# Patient Record
Sex: Female | Born: 1995 | Race: Black or African American | Hispanic: No | Smoking: Never smoker
Health system: Southern US, Community
[De-identification: ages and names within clinical notes are randomized; demographics above are authoritative.]

## PROBLEM LIST (undated history)

## (undated) ENCOUNTER — Inpatient Hospital Stay (HOSPITAL_COMMUNITY): Payer: Self-pay

## (undated) ENCOUNTER — Ambulatory Visit: Admission: EM | Payer: Medicaid Other | Source: Home / Self Care

## (undated) DIAGNOSIS — O139 Gestational [pregnancy-induced] hypertension without significant proteinuria, unspecified trimester: Secondary | ICD-10-CM

## (undated) DIAGNOSIS — Z789 Other specified health status: Secondary | ICD-10-CM

## (undated) DIAGNOSIS — O24419 Gestational diabetes mellitus in pregnancy, unspecified control: Secondary | ICD-10-CM

## (undated) DIAGNOSIS — O09299 Supervision of pregnancy with other poor reproductive or obstetric history, unspecified trimester: Secondary | ICD-10-CM

## (undated) DIAGNOSIS — I10 Essential (primary) hypertension: Secondary | ICD-10-CM

## (undated) HISTORY — DX: Essential (primary) hypertension: I10

## (undated) HISTORY — DX: Gestational (pregnancy-induced) hypertension without significant proteinuria, unspecified trimester: O13.9

## (undated) HISTORY — DX: Gestational diabetes mellitus in pregnancy, unspecified control: O24.419

## (undated) HISTORY — PX: OTHER SURGICAL HISTORY: SHX169

## (undated) HISTORY — DX: Supervision of pregnancy with other poor reproductive or obstetric history, unspecified trimester: O09.299

## (undated) NOTE — *Deleted (*Deleted)
OB ADMISSION/ HISTORY & PHYSICAL:  Admission Date: 03/17/2020  6:05 PM  Admit Diagnosis: gestational hypertension  Dana Solomon is a 57 y.o. female G45P0101 [redacted]w[redacted]d presenting for elevated BP in office. Endorses active FM, denies LOF and vaginal bleeding. Hx of pre-eclampsia w/ last preg. Transitional housing (Lives @ [facility]).   History of current pregnancy: G2P0101   Patient entered care with [facility] at 13+3 wks.   EDC 03/22/20 by [place] @ 8+5 wks   Antenatal testing: for BMI 43 started at 32 weeks Last evaluation: 39+2 wks 7lbs 5oz, 43%, vtx, AFI 8, ant placenta, BPP 8/8 Significant prenatal events:  Patient Active Problem List   Diagnosis Date Noted  . Pre-eclampsia 03/17/2020  . Rh negative state in antepartum period 03/17/2020  . GBS (group B Streptococcus carrier), +RV culture, currently pregnant 03/17/2020  . History of severe pre-eclampsia 03/17/2020  . Maternal obesity affecting pregnancy, antepartum 03/17/2020    Prenatal Labs: ABO, Rh: --/--/O NEG (11/24 1852) Antibody: NEG (11/24 1852) Rubella:   immune RPR:   NR HBsAg:   NR HIV:   NR GTT: passed GBS:   positive GC/CHL: neg/neg Genetics: insufficient fetal DNA Tdap/influenza vaccines: declined both   OB History  Gravida Para Term Preterm AB Living  2 1   1   1   SAB TAB Ectopic Multiple Live Births          1    # Outcome Date GA Lbr Len/2nd Weight Sex Delivery Anes PTL Lv  2 Current           1 Preterm 2014 [redacted]w[redacted]d    Vag-Spont       Medical / Surgical History: Past medical history:  Past Medical History:  Diagnosis Date  . Gestational diabetes    first pregnancy  . Hypertension   . Medical history non-contributory   . Pregnancy induced hypertension     Past surgical history:  Past Surgical History:  Procedure Laterality Date  . Belly button      approx 10 years ago  . naval     age 52y   Family History:  Family History  Problem Relation Age of Onset  . Hypertension Father     Social  History:  reports that she has never smoked. She has never used smokeless tobacco. She reports previous alcohol use. She reports that she does not use drugs.  Allergies: Patient has no known allergies.   Current Medications at time of admission:  Prior to Admission medications   Medication Sig Start Date End Date Taking? Authorizing Provider  aspirin EC 81 MG tablet Take 81 mg by mouth daily. Swallow whole.   Yes [provider]  Magnesium Oxide (MAG-OXIDE) 200 MG TABS Take 2 tablets (400 mg total) by mouth at bedtime. If that amount causes loose stools in the am, switch to 200mg  daily at bedtime. 01/30/20  Yes R, CNM  ondansetron (ZOFRAN ODT) 4 MG disintegrating tablet Take 1 tablet (4 mg total) by mouth every 8 (eight) hours as needed for nausea or vomiting. 10/20/19  Yes Edd Arbour, CNM  Prenatal Vit-Fe Fumarate-FA (PRENATAL MULTIVITAMIN) TABS tablet Take 1 tablet by mouth daily at 12 noon.   Yes [provider]  Elastic Bandages & Supports (COMFORT FIT MATERNITY SUPP LG) MISC 1 Units by Does not apply route daily. 01/30/20   Donette Larry, CNM    Review of Systems: Constitutional: Negative   HENT: Negative   Eyes: Negative   Respiratory:  Negative   Cardiovascular: Negative   Gastrointestinal: Negative  Genitourinary: neg for bloody show, neg for LOF   Musculoskeletal: Negative   Skin: Negative   Neurological: Negative   Endo/Heme/Allergies: Negative   Psychiatric/Behavioral: Negative    Physical Exam: VS: Blood pressure 121/62, pulse (!) 102, temperature 98 F (36.7 C), temperature source Oral, resp. rate 18, height 5' 4.5" (1.638 m), weight 134.1 kg, last menstrual period 06/23/2019. AAO x3, no signs of distress Cardiovascular: RRR Respiratory: Lung fields clear to ausculation GU/GI: Abdomen gravid, non-tender, non-distended, active FM, vertex, EFW 7.5# per Leopold's Extremities: trace, non-pitting edema, negative for pain,  tenderness, and cords  Cervical exam:Dilation: Fingertip Effacement (%): Thick Station: Ballotable Exam by:: Lizbeth Bark CNM FHR: baseline rate 125 / variability moderate / accelerations present / absent decelerations TOCO: irreg   Prenatal Transfer Tool  Maternal Diabetes: No Genetic Screening: insufficient fetal sample Maternal Ultrasounds/Referrals: Normal Fetal Ultrasounds or other Referrals:  None Maternal Substance Abuse:  No Significant Maternal Medications:  None Significant Maternal Lab Results: Group B Strep positive    Assessment: 25 y.o. G2P0101 [redacted]w[redacted]d  IOL for GHTN Rh neg FHR category 1 GBS positive Pain management plan: desires unmedicated labor and birth   Plan:  Admit to L&D Routine admission orders Epidural PRN PCN for GBS prophylaxis Rhogam PP Dr Richardson Dopp notified of admission and plan of care  Roma Schanz MSN, CNM 03/17/2020 9:42 PM

---
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
## 2018-07-18 DIAGNOSIS — B9689 Other specified bacterial agents as the cause of diseases classified elsewhere: Secondary | ICD-10-CM | POA: Diagnosis not present

## 2018-07-18 DIAGNOSIS — Z975 Presence of (intrauterine) contraceptive device: Secondary | ICD-10-CM | POA: Diagnosis not present

## 2018-07-18 DIAGNOSIS — N912 Amenorrhea, unspecified: Secondary | ICD-10-CM | POA: Diagnosis not present

## 2018-07-18 DIAGNOSIS — N760 Acute vaginitis: Secondary | ICD-10-CM | POA: Diagnosis not present

## 2018-07-18 DIAGNOSIS — Z3202 Encounter for pregnancy test, result negative: Secondary | ICD-10-CM | POA: Diagnosis not present

## 2019-04-25 NOTE — L&D Delivery Note (Signed)
Delivery Note:   G2P0101 at [redacted]w[redacted]d  Admitting diagnosis: Pre-eclampsia [O14.90] Risks: Hypertension, GBS + Onset of labor: 11/25 @ 1439 IOL/Augmentation: AROM, Pitocin, Cytotec and IP Foley ROM: 11/25 @ 1439  Complete dilation at 03/18/2020  2045 Onset of pushing at 2048 FHR second stage Cat I  Analgesia /Anesthesia intrapartum:Epidural  Pushing in lithotomy position with CNM and L&D staff support. No support person present.   Delivery of a Live born female  Birth Weight: 2886g, 6lb 5.8oz  APGAR: 8, 9   Newborn Delivery   Birth date/time: 03/18/2020 20:51:00 Delivery type: Vaginal, Spontaneous      in cephalic presentation, position OA to LOA.  APGAR:1 min-8 , 5 min-9   Nuchal Cord: No  Cord double clamped after cessation of pulsation, cut by CNM.  Collection of cord blood for typing completed. Cord blood donation-None  Arterial cord blood sample-No    Placenta delivered-Spontaneous  with 3 vessels . Uterotonics: Pitocin Placenta to L&D Uterine tone firm  Bleeding scant  Small bleeding abrasion at the vaginal opening identified.  Episiotomy:None  Local analgesia: N/A  Repair: 2-0 in usual fashion with excellent hemostasis Est. Blood Loss (mL):50.00   Complications: None  Mom to postpartum.  Baby Na'zy to Couplet care / Skin to Skin.  Delivery Report:   Review the Delivery Report for details.    [name], CNM, MSN 03/18/2020, 9:10 PM

## 2019-07-30 DIAGNOSIS — O26891 Other specified pregnancy related conditions, first trimester: Secondary | ICD-10-CM | POA: Diagnosis not present

## 2019-07-30 DIAGNOSIS — Z3201 Encounter for pregnancy test, result positive: Secondary | ICD-10-CM | POA: Diagnosis not present

## 2019-07-30 DIAGNOSIS — Z3A00 Weeks of gestation of pregnancy not specified: Secondary | ICD-10-CM | POA: Diagnosis not present

## 2019-07-30 DIAGNOSIS — R109 Unspecified abdominal pain: Secondary | ICD-10-CM | POA: Diagnosis not present

## 2019-07-30 DIAGNOSIS — Z79899 Other long term (current) drug therapy: Secondary | ICD-10-CM | POA: Diagnosis not present

## 2019-08-19 DIAGNOSIS — O26851 Spotting complicating pregnancy, first trimester: Secondary | ICD-10-CM | POA: Diagnosis not present

## 2019-08-19 DIAGNOSIS — O3680X0 Pregnancy with inconclusive fetal viability, not applicable or unspecified: Secondary | ICD-10-CM | POA: Diagnosis not present

## 2019-08-19 DIAGNOSIS — N926 Irregular menstruation, unspecified: Secondary | ICD-10-CM | POA: Diagnosis not present

## 2019-09-02 DIAGNOSIS — Z8751 Personal history of pre-term labor: Secondary | ICD-10-CM | POA: Insufficient documentation

## 2019-09-02 DIAGNOSIS — Z3481 Encounter for supervision of other normal pregnancy, first trimester: Secondary | ICD-10-CM | POA: Diagnosis not present

## 2019-09-03 DIAGNOSIS — Z7689 Persons encountering health services in other specified circumstances: Secondary | ICD-10-CM | POA: Diagnosis not present

## 2019-09-11 ENCOUNTER — Encounter (HOSPITAL_COMMUNITY): Payer: Self-pay | Admitting: Obstetrics & Gynecology

## 2019-09-11 ENCOUNTER — Inpatient Hospital Stay (HOSPITAL_COMMUNITY)
Admission: AD | Admit: 2019-09-11 | Discharge: 2019-09-11 | Disposition: A | Discharge status: Home / Self Care | Payer: Medicaid Other | Attending: Obstetrics & Gynecology | Admitting: Obstetrics & Gynecology

## 2019-09-11 ENCOUNTER — Inpatient Hospital Stay (HOSPITAL_COMMUNITY): Payer: Medicaid Other

## 2019-09-11 DIAGNOSIS — O021 Missed abortion: Secondary | ICD-10-CM | POA: Insufficient documentation

## 2019-09-11 DIAGNOSIS — Z3A12 12 weeks gestation of pregnancy: Secondary | ICD-10-CM | POA: Diagnosis not present

## 2019-09-11 DIAGNOSIS — O26891 Other specified pregnancy related conditions, first trimester: Secondary | ICD-10-CM

## 2019-09-11 DIAGNOSIS — Z590 Homelessness: Secondary | ICD-10-CM | POA: Insufficient documentation

## 2019-09-11 DIAGNOSIS — R102 Pelvic and perineal pain: Secondary | ICD-10-CM | POA: Diagnosis not present

## 2019-09-11 DIAGNOSIS — Z3A09 9 weeks gestation of pregnancy: Secondary | ICD-10-CM | POA: Insufficient documentation

## 2019-09-11 DIAGNOSIS — R109 Unspecified abdominal pain: Secondary | ICD-10-CM | POA: Diagnosis not present

## 2019-09-11 DIAGNOSIS — Z658 Other specified problems related to psychosocial circumstances: Secondary | ICD-10-CM

## 2019-09-11 LAB — POCT PREGNANCY, URINE: Preg Test, Ur: POSITIVE — AB

## 2019-09-11 NOTE — MAU Provider Note (Signed)
Chief Complaint: needs pregnancy confirmation letter   First Provider Initiated Contact with Patient 09/11/19 2023        SUBJECTIVE HPI: Dana Solomon is a 24 y.o. G1P0 at [redacted]w[redacted]d by LMP who presents to maternity admissions reporting pelvic cramping that comes and goes.  Had some care in [place] but had to move here to live in a maternity home ([facility]) and needs a proof of pregnancy letter.  States had one US showing SIUP but no embryo.  She denies vaginal bleeding, vaginal itching/burning, urinary symptoms, h/a, dizziness, n/v, or fever/chills  Abdominal Pain This is a recurrent problem. The current episode started in the past 7 days. The onset quality is gradual. The problem occurs intermittently. The problem has been unchanged. The patient is experiencing no pain (no pain today). The quality of the pain is cramping. The abdominal pain does not radiate. Pertinent negatives include no constipation, diarrhea, dysuria, fever or frequency. Nothing aggravates the pain. The pain is relieved by nothing. She has tried nothing for the symptoms.   RN Note: Needs pregnancy confirmation letter for shelter where she lives. Was getting care in [facility] and has just moved here. Has appt with [facility] end of May. No pain or bleeding tonight. Has occ abd pain and back pain that eventually goes away.   No past medical history on file.  Social History   Socioeconomic History  . Marital status: Single    Spouse name: Not on file  . Number of children: Not on file  . Years of education: Not on file  . Highest education level: Not on file  Occupational History  . Not on file  Tobacco Use  . Smoking status: Not on file  Substance and Sexual Activity  . Alcohol use: Not on file  . Drug use: Not on file  . Sexual activity: Not on file  Other Topics Concern  . Not on file  Social History Narrative  . Not on file   Social Determinants of Health   Financial Resource Strain:   . Difficulty  of Paying Living Expenses:   Food Insecurity:   . Worried About Programme researcher, broadcasting/film/video in the Last Year:   . Barista in the Last Year:   Transportation Needs:   . Freight forwarder (Medical):   Marland Kitchen Lack of Transportation (Non-Medical):   Physical Activity:   . Days of Exercise per Week:   . Minutes of Exercise per Session:   Stress:   . Feeling of Stress :   Social Connections:   . Frequency of Communication with Friends and Family:   . Frequency of Social Gatherings with Friends and Family:   . Attends Religious Services:   . Active Member of Clubs or Organizations:   . Attends Banker Meetings:   Marland Kitchen Marital Status:   Intimate Partner Violence:   . Fear of Current or Ex-Partner:   . Emotionally Abused:   Marland Kitchen Physically Abused:   . Sexually Abused:    No current facility-administered medications on file prior to encounter.   No current outpatient medications on file prior to encounter.   Not on File  I have reviewed patient's Past Medical Hx, Surgical Hx, Family Hx, Social Hx, medications and allergies.   ROS:  Review of Systems  Constitutional: Negative for fever.  Gastrointestinal: Positive for abdominal pain. Negative for constipation and diarrhea.  Genitourinary: Negative for dysuria and frequency.   Review of Systems  Other systems  negative   Physical Exam  Physical Exam Patient Vitals for the past 24 hrs:  BP Temp Pulse Resp Height Weight  09/11/19 1938 118/90 98.6 F (37 C) (!) 104 18 5' 4.5" (1.638 m) 126.6 kg   Constitutional: Well-developed, well-nourished female in no acute distress.  Cardiovascular: normal rate Respiratory: normal effort GI: Abd soft, non-tender. Pos BS x 4 MS: Extremities nontender, no edema, normal ROM Neurologic: Alert and oriented x 4.  GU: Neg CVAT.  PELVIC EXAM: Deferred due to recent exam and no current pain or bleeding.   LAB RESULTS Results for orders placed or performed during the hospital encounter  of 09/11/19 (from the past 24 hour(s))  Pregnancy, urine POC     Status: Abnormal   Collection Time: 09/11/19  7:58 PM  Result Value Ref Range   Preg Test, Ur POSITIVE (A) NEGATIVE     IMAGING US OB Comp Less 14 Wks  Result Date: 09/11/2019 CLINICAL DATA:  Abdominal and back pain. Gestational age by last menstrual period is 9 weeks 0 days. EXAM: OBSTETRIC <14 WK [place] AND TRANSVAGINAL OB US TECHNIQUE: Both transabdominal and transvaginal ultrasound examinations were performed for complete evaluation of the gestation as well as the maternal uterus, adnexal regions, and pelvic cul-de-sac. Transvaginal technique was performed to assess early pregnancy. COMPARISON:  None. FINDINGS: Intrauterine gestational sac: Single Yolk sac:  Not Visualized. Embryo:  Visualized. Cardiac Activity: Visualized. Heart Rate: 161 bpm CRL:  59.9 mm   12 w   3 d                  [place] EDC: 03/22/2020 Subchorionic hemorrhage:  None visualized. Maternal uterus/adnexae: Normal. IMPRESSION: Single live intrauterine pregnancy.  No subchorionic hemorrhage. Electronically Signed   By: Zerita Boers M.D.   On: 09/11/2019 21:05     MAU Management/MDM: Ordered followup Ultrasound to rule out missed abortion.  This pain can represent a normal pregnancy with pain, missed abortion.  The process as listed above helps to determine which of these is present.  ASSESSMENT [place] IUP at [redacted]w[redacted]d Pelvic pain in early pregnancy Low social support  PLAN Discharge home Recommend followup with prenatal visits as scheduled Letter of proof of pregnancy given  Pt stable at time of discharge. Encouraged to return here or to other Urgent Care/ED if she develops worsening of symptoms, increase in pain, fever, or other concerning symptoms.    Hansel Feinstein CNM, MSN Certified Nurse-Midwife 09/11/2019  8:23 PM

## 2019-09-11 NOTE — Discharge Instructions (Signed)
First Trimester of Pregnancy The first trimester of pregnancy is from week 1 until the end of week 13 (months 1 through 3). A week after a sperm fertilizes an egg, the egg will implant on the wall of the uterus. This embryo will begin to develop into a baby. Genes from you and your partner will form the baby. The female genes will determine whether the baby will be a boy or a girl. At 6-8 weeks, the eyes and face will be formed, and the heartbeat can be seen on ultrasound. At the end of 12 weeks, all the baby's organs will be formed. Now that you are pregnant, you will want to do everything you can to have a healthy baby. Two of the most important things are to get good prenatal care and to follow your health care provider's instructions. Prenatal care is all the medical care you receive before the baby's birth. This care will help prevent, find, and treat any problems during the pregnancy and childbirth. Body changes during your first trimester Your body goes through many changes during pregnancy. The changes vary from woman to woman.  You may gain or lose a couple of pounds at first.  You may feel sick to your stomach (nauseous) and you may throw up (vomit). If the vomiting is uncontrollable, call your health care provider.  You may tire easily.  You may develop headaches that can be relieved by medicines. All medicines should be approved by your health care provider.  You may urinate more often. Painful urination may mean you have a bladder infection.  You may develop heartburn as a result of your pregnancy.  You may develop constipation because certain hormones are causing the muscles that push stool through your intestines to slow down.  You may develop hemorrhoids or swollen veins (varicose veins).  Your breasts may begin to grow larger and become tender. Your nipples may stick out more, and the tissue that surrounds them (areola) may become darker.  Your gums may bleed and may be  sensitive to brushing and flossing.  Dark spots or blotches (chloasma, mask of pregnancy) may develop on your face. This will likely fade after the baby is born.  Your menstrual periods will stop.  You may have a loss of appetite.  You may develop cravings for certain kinds of food.  You may have changes in your emotions from day to day, such as being excited to be pregnant or being concerned that something may go wrong with the pregnancy and baby.  You may have more vivid and strange dreams.  You may have changes in your hair. These can include thickening of your hair, rapid growth, and changes in texture. Some women also have hair loss during or after pregnancy, or hair that feels dry or thin. Your hair will most likely return to normal after your baby is born. What to expect at prenatal visits During a routine prenatal visit:  You will be weighed to make sure you and the baby are growing normally.  Your blood pressure will be taken.  Your abdomen will be measured to track your baby's growth.  The fetal heartbeat will be listened to between weeks 10 and 14 of your pregnancy.  Test results from any previous visits will be discussed. Your health care provider may ask you:  How you are feeling.  If you are feeling the baby move.  If you have had any abnormal symptoms, such as leaking fluid, bleeding, severe headaches, or abdominal   cramping.  If you are using any tobacco products, including cigarettes, chewing tobacco, and electronic cigarettes.  If you have any questions. Other tests that may be performed during your first trimester include:  Blood tests to find your blood type and to check for the presence of any previous infections. The tests will also be used to check for low iron levels (anemia) and protein on red blood cells (Rh antibodies). Depending on your risk factors, or if you previously had diabetes during pregnancy, you may have tests to check for high blood sugar  that affects pregnant women (gestational diabetes).  Urine tests to check for infections, diabetes, or protein in the urine.  An ultrasound to confirm the proper growth and development of the baby.  Fetal screens for spinal cord problems (spina bifida) and Down syndrome.  HIV (human immunodeficiency virus) testing. Routine prenatal testing includes screening for HIV, unless you choose not to have this test.  You may need other tests to make sure you and the baby are doing well. Follow these instructions at home: Medicines  Follow your health care provider's instructions regarding medicine use. Specific medicines may be either safe or unsafe to take during pregnancy.  Take a prenatal vitamin that contains at least 600 micrograms (mcg) of folic acid.  If you develop constipation, try taking a stool softener if your health care provider approves. Eating and drinking   Eat a balanced diet that includes fresh fruits and vegetables, whole grains, good sources of protein such as meat, eggs, or tofu, and low-fat dairy. Your health care provider will help you determine the amount of weight gain that is right for you.  Avoid raw meat and uncooked cheese. These carry germs that can cause birth defects in the baby.  Eating four or five small meals rather than three large meals a day may help relieve nausea and vomiting. If you start to feel nauseous, eating a few soda crackers can be helpful. Drinking liquids between meals, instead of during meals, also seems to help ease nausea and vomiting.  Limit foods that are high in fat and processed sugars, such as fried and sweet foods.  To prevent constipation: ? Eat foods that are high in fiber, such as fresh fruits and vegetables, whole grains, and beans. ? Drink enough fluid to keep your urine clear or pale yellow. Activity  Exercise only as directed by your health care provider. Most women can continue their usual exercise routine during  pregnancy. Try to exercise for 30 minutes at least 5 days a week. Exercising will help you: ? Control your weight. ? Stay in shape. ? Be prepared for labor and delivery.  Experiencing pain or cramping in the lower abdomen or lower back is a good sign that you should stop exercising. Check with your health care provider before continuing with normal exercises.  Try to avoid standing for long periods of time. Move your legs often if you must stand in one place for a long time.  Avoid heavy lifting.  Wear low-heeled shoes and practice good posture.  You may continue to have sex unless your health care provider tells you not to. Relieving pain and discomfort  Wear a good support bra to relieve breast tenderness.  Take warm sitz baths to soothe any pain or discomfort caused by hemorrhoids. Use hemorrhoid cream if your health care provider approves.  Rest with your legs elevated if you have leg cramps or low back pain.  If you develop varicose veins in   your legs, wear support hose. Elevate your feet for 15 minutes, 3-4 times a day. Limit salt in your diet. Prenatal care  Schedule your prenatal visits by the twelfth week of pregnancy. They are usually scheduled monthly at first, then more often in the last 2 months before delivery.  Write down your questions. Take them to your prenatal visits.  Keep all your prenatal visits as told by your health care provider. This is important. Safety  Wear your seat belt at all times when driving.  Make a list of emergency phone numbers, including numbers for family, friends, the hospital, and police and fire departments. General instructions  Ask your health care provider for a referral to a local prenatal education class. Begin classes no later than the beginning of month 6 of your pregnancy.  Ask for help if you have counseling or nutritional needs during pregnancy. Your health care provider can offer advice or refer you to specialists for help  with various needs.  Do not use hot tubs, steam rooms, or saunas.  Do not douche or use tampons or scented sanitary pads.  Do not cross your legs for long periods of time.  Avoid cat litter boxes and soil used by cats. These carry germs that can cause birth defects in the baby and possibly loss of the fetus by miscarriage or stillbirth.  Avoid all smoking, herbs, alcohol, and medicines not prescribed by your health care provider. Chemicals in these products affect the formation and growth of the baby.  Do not use any products that contain nicotine or tobacco, such as cigarettes and e-cigarettes. If you need help quitting, ask your health care provider. You may receive counseling support and other resources to help you quit.  Schedule a dentist appointment. At home, brush your teeth with a soft toothbrush and be gentle when you floss. Contact a health care provider if:  You have dizziness.  You have mild pelvic cramps, pelvic pressure, or nagging pain in the abdominal area.  You have persistent nausea, vomiting, or diarrhea.  You have a bad smelling vaginal discharge.  You have pain when you urinate.  You notice increased swelling in your face, hands, legs, or ankles.  You are exposed to fifth disease or chickenpox.  You are exposed to German measles (rubella) and have never had it. Get help right away if:  You have a fever.  You are leaking fluid from your vagina.  You have spotting or bleeding from your vagina.  You have severe abdominal cramping or pain.  You have rapid weight gain or loss.  You vomit blood or material that looks like coffee grounds.  You develop a severe headache.  You have shortness of breath.  You have any kind of trauma, such as from a fall or a car accident. Summary  The first trimester of pregnancy is from week 1 until the end of week 13 (months 1 through 3).  Your body goes through many changes during pregnancy. The changes vary from  woman to woman.  You will have routine prenatal visits. During those visits, your health care provider will examine you, discuss any test results you may have, and talk with you about how you are feeling. This information is not intended to replace advice given to you by your health care provider. Make sure you discuss any questions you have with your health care provider. Document Revised: 03/23/2017 Document Reviewed: 03/22/2016 Elsevier Patient Education  2020 [employer] Inc.  

## 2019-09-11 NOTE — MAU Note (Signed)
Wynelle Bourgeois CNM in Triage talking with pt.

## 2019-09-11 NOTE — MAU Note (Signed)
Needs pregnancy confirmation letter for shelter where she lives. Was getting care in [facility] and has just moved here. Has appt with [facility] end of May. No pain or bleeding tonight. Has occ abd pain and back pain that eventually goes away.

## 2019-09-11 NOTE — MAU Note (Signed)
Wynelle Bourgeois CNM in Triage to discuss u/s results and d/c plan with pt. Pt did receive preg verification letter at d/c. Will keep scheduled appt with [facility]

## 2019-09-18 DIAGNOSIS — O3680X9 Pregnancy with inconclusive fetal viability, other fetus: Secondary | ICD-10-CM | POA: Diagnosis not present

## 2019-09-18 DIAGNOSIS — N925 Other specified irregular menstruation: Secondary | ICD-10-CM | POA: Diagnosis not present

## 2019-09-18 DIAGNOSIS — Z3A13 13 weeks gestation of pregnancy: Secondary | ICD-10-CM | POA: Diagnosis not present

## 2019-09-25 ENCOUNTER — Other Ambulatory Visit: Payer: Self-pay

## 2019-09-25 DIAGNOSIS — Z113 Encounter for screening for infections with a predominantly sexual mode of transmission: Secondary | ICD-10-CM | POA: Diagnosis not present

## 2019-09-25 DIAGNOSIS — Z3A14 14 weeks gestation of pregnancy: Secondary | ICD-10-CM | POA: Diagnosis not present

## 2019-09-26 ENCOUNTER — Other Ambulatory Visit: Payer: Self-pay

## 2019-09-26 DIAGNOSIS — Z8759 Personal history of other complications of pregnancy, childbirth and the puerperium: Secondary | ICD-10-CM | POA: Diagnosis not present

## 2019-10-15 DIAGNOSIS — R7309 Other abnormal glucose: Secondary | ICD-10-CM | POA: Diagnosis not present

## 2019-10-20 ENCOUNTER — Inpatient Hospital Stay (HOSPITAL_COMMUNITY)
Admission: AD | Admit: 2019-10-20 | Discharge: 2019-10-20 | Disposition: A | Discharge status: Home / Self Care | Payer: Medicaid Other | Attending: Obstetrics and Gynecology | Admitting: Obstetrics and Gynecology

## 2019-10-20 ENCOUNTER — Other Ambulatory Visit: Payer: Self-pay

## 2019-10-20 ENCOUNTER — Encounter (HOSPITAL_COMMUNITY): Payer: Self-pay | Admitting: Obstetrics and Gynecology

## 2019-10-20 DIAGNOSIS — E860 Dehydration: Secondary | ICD-10-CM | POA: Diagnosis not present

## 2019-10-20 DIAGNOSIS — O211 Hyperemesis gravidarum with metabolic disturbance: Secondary | ICD-10-CM | POA: Diagnosis not present

## 2019-10-20 DIAGNOSIS — O98813 Other maternal infectious and parasitic diseases complicating pregnancy, third trimester: Secondary | ICD-10-CM | POA: Diagnosis not present

## 2019-10-20 DIAGNOSIS — A059 Bacterial foodborne intoxication, unspecified: Secondary | ICD-10-CM | POA: Diagnosis not present

## 2019-10-20 DIAGNOSIS — Z3A18 18 weeks gestation of pregnancy: Secondary | ICD-10-CM | POA: Insufficient documentation

## 2019-10-20 DIAGNOSIS — O219 Vomiting of pregnancy, unspecified: Secondary | ICD-10-CM | POA: Insufficient documentation

## 2019-10-20 DIAGNOSIS — O99282 Endocrine, nutritional and metabolic diseases complicating pregnancy, second trimester: Secondary | ICD-10-CM | POA: Insufficient documentation

## 2019-10-20 DIAGNOSIS — Z79899 Other long term (current) drug therapy: Secondary | ICD-10-CM | POA: Diagnosis not present

## 2019-10-20 HISTORY — DX: Other specified health status: Z78.9

## 2019-10-20 LAB — URINALYSIS, ROUTINE W REFLEX MICROSCOPIC
Bilirubin Urine: NEGATIVE
Glucose, UA: NEGATIVE mg/dL
Hgb urine dipstick: NEGATIVE
Ketones, ur: 20 mg/dL — AB
Leukocytes,Ua: NEGATIVE
Nitrite: NEGATIVE
Protein, ur: NEGATIVE mg/dL
Specific Gravity, Urine: 1.024 (ref 1.005–1.030)
pH: 7 (ref 5.0–8.0)

## 2019-10-20 LAB — CBC
HCT: 38.1 % (ref 36.0–46.0)
Hemoglobin: 11.7 g/dL — ABNORMAL LOW (ref 12.0–15.0)
MCH: 23.2 pg — ABNORMAL LOW (ref 26.0–34.0)
MCHC: 30.7 g/dL (ref 30.0–36.0)
MCV: 75.4 fL — ABNORMAL LOW (ref 80.0–100.0)
Platelets: 310 10*3/uL (ref 150–400)
RBC: 5.05 MIL/uL (ref 3.87–5.11)
RDW: 14.5 % (ref 11.5–15.5)
WBC: 7.4 10*3/uL (ref 4.0–10.5)
nRBC: 0 % (ref 0.0–0.2)

## 2019-10-20 LAB — BASIC METABOLIC PANEL
Anion gap: 10 (ref 5–15)
BUN: 5 mg/dL — ABNORMAL LOW (ref 6–20)
CO2: 20 mmol/L — ABNORMAL LOW (ref 22–32)
Calcium: 9.6 mg/dL (ref 8.9–10.3)
Chloride: 104 mmol/L (ref 98–111)
Creatinine, Ser: 0.55 mg/dL (ref 0.44–1.00)
GFR calc Af Amer: >60 mL/min (ref >60)
GFR calc non Af Amer: >60 mL/min (ref >60)
Glucose, Bld: 85 mg/dL (ref 70–99)
Potassium: 5 mmol/L (ref 3.5–5.1)
Sodium: 134 mmol/L — ABNORMAL LOW (ref 135–145)

## 2019-10-20 MED ORDER — FAMOTIDINE IN NACL 20-0.9 MG/50ML-% IV SOLN
20.0000 mg | Freq: Once | INTRAVENOUS | Status: AC
  Administered 2019-10-20: 20 mg via INTRAVENOUS
  Filled 2019-10-20: qty 50

## 2019-10-20 MED ORDER — ONDANSETRON HCL 4 MG/2ML IJ SOLN
4.0000 mg | Freq: Once | INTRAMUSCULAR | Status: AC
  Administered 2019-10-20: 4 mg via INTRAVENOUS
  Filled 2019-10-20: qty 2

## 2019-10-20 MED ORDER — LACTATED RINGERS IV BOLUS
1000.0000 mL | Freq: Once | INTRAVENOUS | Status: AC
  Administered 2019-10-20: 1000 mL via INTRAVENOUS

## 2019-10-20 MED ORDER — ONDANSETRON 4 MG PO TBDP
4.0000 mg | ORAL_TABLET | Freq: Three times a day (TID) | ORAL | 0 refills | Status: DC | PRN
Start: 2019-10-20 — End: 2021-10-03

## 2019-10-20 NOTE — Discharge Instructions (Signed)
Food Poisoning Food poisoning is an illness that is caused by eating or drinking contaminated foods or drinks. In most cases, food poisoning is mild and lasts 1-2 days. However, some cases can be serious, especially for people who have weak body defense systems (immune systems), older people, children and infants, and pregnant women. What are the causes? This condition is caused by contaminated food. Foods can become contaminated with viruses, bacteria, parasites, or mold due to:  Poor personal hygiene, such as poor hand-washing practices.  Storing food improperly, such as not refrigerating raw meat.  Using unclean surfaces for preparing, serving, and storing food.  Cooking or eating with unclean utensils. If contaminated food is eaten, viruses, bacteria, or parasites can harm the intestine. This often causes severe diarrhea. The most common causes of food poisoning include:  Viruses, such as: ? Norovirus. ? Rotavirus.  Bacteria, such as: ? Salmonella. ? Listeria. ? E. coli (Escherichia coli).  Parasites, such as: ? Giardia. ? Toxoplasma gondii. What are the signs or symptoms? Symptoms may take several hours to appear after you consume contaminated food or drink. Symptoms include:  Nausea.  Vomiting.  Cramping.  Diarrhea.  Fever and chills.  Muscle aches.  Dehydration. Dehydration can cause you to be tired and thirsty, have a dry mouth, and urinate less frequently. How is this diagnosed? Your health care provider can diagnose food poisoning with your medical history and a physical exam. This will include asking you what you have recently eaten. You may also have tests, including:  Blood tests.  Stool tests. How is this treated? Treatment focuses on relieving your symptoms and making sure that you are hydrated. You may also be given medicines. In severe cases, hospitalization may be required and you may need to receive fluids through an IV. Follow these instructions  at home: Eating and drinking   Drink enough fluids to keep your urine pale yellow. You may need to drink small amounts of clear liquids frequently.  Avoid milk, caffeine, and alcohol.  Ask your health care provider for specific rehydration instructions.  Eat small, frequent meals rather than large meals. Medicines  Take over-the-counter and prescription medicines only as told by your health care provider. Ask your health care provider if you should continue to take any of your regular prescribed and over-the-counter medicines.  If you were prescribed an antibiotic medicine, take it as told by your health care provider. Do not stop taking the antibiotic even if you start to feel better. General instructions   Wash your hands thoroughly before you prepare food and after you go to the bathroom (use the toilet). Make sure that the people who live with you also wash their hands often.  Rest at home until you feel better.  Clean surfaces that you touch with a product that contains chlorine bleach.  Keep all follow-up visits as told by your health care provider. This is important. How is this prevented?  Wash your hands, food preparation surfaces, and utensils thoroughly before and after you handle raw foods.  Use separate food preparation surfaces and storage spaces for raw meat and for fruits and vegetables.  Keep refrigerated foods colder than 40F (5C).  Serve hot foods immediately or keep them heated above 140F (60C).  Store dry foods in cool, dry spaces away from excess heat or moisture. Throw out any foods that do not smell right or are in cans that are bulging.  Follow approved canning procedures.  Heat canned foods thoroughly before you taste   them.  Drink bottled or sterile water when you travel. Get help right away if:  You have difficulty breathing, swallowing, talking, or moving.  You develop blurred vision.  You cannot eat or drink without vomiting.  You  faint.  Your eyes turn yellow.  Your vomiting or diarrhea is persistent.  Abdominal pain develops, increases, or localizes in one small area.  You have a fever.  You have blood or mucus in your stools, or your stools look dark [place] and tarry.  You have signs of dehydration, such as: ? Dark urine, very little urine, or no urine. ? Cracked lips. ? Not making tears while crying. ? Dry mouth. ? Sunken eyes. ? Sleepiness. ? Weakness. ? Dizziness. These symptoms may represent a serious problem that is an emergency. Do not wait to see if the symptoms will go away. Get medical help right away. Call your local emergency services (911 in the [place] Do not drive yourself to the hospital. Summary  Food poisoning is an illness that is caused by eating or drinking contaminated foods or drinks.  Symptoms may include nausea, vomiting, diarrhea, muscle aches, cramping, fever, chills, and dehydration.  In most cases, food poisoning is mild and lasts 1-2 days.  In severe cases, hospitalization may be required. This information is not intended to replace advice given to you by your health care provider. Make sure you discuss any questions you have with your health care provider. Document Revised: 01/23/2018 Document Reviewed: 01/23/2018 Elsevier Patient Education  [employer].   Morning Sickness  Morning sickness is when you feel sick to your stomach (nauseous) during pregnancy. You may feel sick to your stomach and throw up (vomit). You may feel sick in the morning, but you can feel this way at any time of day. Some women feel very sick to their stomach and cannot stop throwing up (hyperemesis gravidarum). Follow these instructions at home: Medicines  Take over-the-counter and prescription medicines only as told by your doctor. Do not take any medicines until you talk with your doctor about them first.  Taking multivitamins before getting pregnant can stop or lessen the harshness of  morning sickness. Eating and drinking  Eat dry toast or crackers before getting out of bed.  Eat 5 or 6 small meals a day.  Eat dry and bland foods like rice and baked potatoes.  Do not eat greasy, fatty, or spicy foods.  Have someone cook for you if the smell of food causes you to feel sick or throw up.  If you feel sick to your stomach after taking prenatal vitamins, take them at night or with a snack.  Eat protein when you need a snack. Nuts, yogurt, and cheese are good choices.  Drink fluids throughout the day.  Try ginger ale made with real ginger, ginger tea made from fresh grated ginger, or ginger candies. General instructions  Do not use any products that have nicotine or tobacco in them, such as cigarettes and e-cigarettes. If you need help quitting, ask your doctor.  Use an air purifier to keep the air in your house free of smells.  Get lots of fresh air.  Try to avoid smells that make you feel sick.  Try: ? Wearing a bracelet that is used for seasickness (acupressure wristband). ? Going to a doctor who puts thin needles into certain body points (acupuncture) to improve how you feel. Contact a doctor if:  You need medicine to feel better.  You feel dizzy or light-headed.  You are losing weight. Get help right away if:  You feel very sick to your stomach and cannot stop throwing up.  You pass out (faint).  You have very bad pain in your belly. Summary  Morning sickness is when you feel sick to your stomach (nauseous) during pregnancy.  You may feel sick in the morning, but you can feel this way at any time of day.  Making some changes to what you eat may help your symptoms go away. This information is not intended to replace advice given to you by your health care provider. Make sure you discuss any questions you have with your health care provider. Document Revised: 03/23/2017 Document Reviewed: 05/11/2016 Elsevier Patient Education  2020 [employer].

## 2019-10-20 NOTE — MAU Provider Note (Signed)
History     CSN: [id number]  Arrival date and time: 10/20/19 1017   First Provider Initiated Contact with Patient 10/20/19 1224      Chief Complaint  Patient presents with   Emesis   24 y.o. G2P1001 @18 .0 wks presenting with N/V. Sx started last night around 1800 shortly after she ate supper. She did have take out food. Denies fever or diarrhea. Had 3 episodes of emesis last night and 2 today. Denies abdominal pain or VB. She reports morning sickness throughout pregnancy that she manages with club soda and crackers.   OB History    Gravida  2   Para  1   Term      Preterm      AB      Living        SAB      TAB      Ectopic      Multiple      Live Births  1           Past Medical History:  Diagnosis Date   Medical history non-contributory     History reviewed. No pertinent surgical history.  No family history on file.  Social History   Tobacco Use   Smoking status: Never Smoker   Smokeless tobacco: Never Used  Vaping Use   Vaping Use: Never used  Substance Use Topics   Alcohol use: Not Currently   Drug use: Never    Allergies: No Known Allergies  Medications Prior to Admission  Medication Sig Dispense Refill Last Dose   Prenatal Vit-Fe Fumarate-FA (PRENATAL MULTIVITAMIN) TABS tablet Take 1 tablet by mouth daily at 12 noon.       Review of Systems  Constitutional: Negative for fever.  Gastrointestinal: Positive for nausea and vomiting. Negative for abdominal pain and diarrhea.  Genitourinary: Negative for vaginal bleeding and vaginal discharge.   Physical Exam   Blood pressure 129/73, pulse 99, temperature 98.5 F (36.9 C), resp. rate 18, height 5\' 4"  (1.626 m), weight 127 kg, last menstrual period 07/10/2019, SpO2 100 %.  Physical Exam Vitals and nursing note reviewed. Exam conducted with a chaperone present.  Constitutional:      Appearance: Normal appearance.  HENT:     Head: Normocephalic and atraumatic.   Cardiovascular:     Rate and Rhythm: Normal rate.  Pulmonary:     Effort: Pulmonary effort is normal. No respiratory distress.  Abdominal:     General: There is no distension.     Palpations: Abdomen is soft.     Tenderness: There is no abdominal tenderness.  Musculoskeletal:        General: Normal range of motion.  Skin:    General: Skin is warm and dry.  Neurological:     Mental Status: She is alert.  Psychiatric:        Mood and Affect: Mood normal.   FHT 153  Results for orders placed or performed during the hospital encounter of 10/20/19 (from the past 24 hour(s))  Urinalysis, Routine w reflex microscopic     Status: Abnormal   Collection Time: 10/20/19 11:14 AM  Result Value Ref Range   Color, Urine YELLOW YELLOW   APPearance CLEAR CLEAR   Specific Gravity, Urine 1.024 1.005 - 1.030   pH 7.0 5.0 - 8.0   Glucose, UA NEGATIVE NEGATIVE mg/dL   Hgb urine dipstick NEGATIVE NEGATIVE   Bilirubin Urine NEGATIVE NEGATIVE   Ketones, ur 20 (A) NEGATIVE mg/dL   Protein,  ur NEGATIVE NEGATIVE mg/dL   Nitrite NEGATIVE NEGATIVE   Leukocytes,Ua NEGATIVE NEGATIVE   MAU Course  Procedures LR Zofran Pepcid  MDM Labs ordered and reviewed. No emesis while here, tolerating po, feels better. Suspect food poisoning vs morning sickness, will offer Zofran ODT prn. Stable for discharge home.  Assessment and Plan   1. [redacted] weeks gestation of pregnancy   2. Dehydration   3. Food poisoning   4. Nausea/vomiting in pregnancy    Discharge home Follow up at [facility] this week as scheduled Rx Zofran Maintain hydration Return precautions  Allergies as of 10/20/2019   No Known Allergies     Medication List    TAKE these medications   ondansetron 4 MG disintegrating tablet Commonly known as: Zofran ODT Take 1 tablet (4 mg total) by mouth every 8 (eight) hours as needed for nausea or vomiting.   prenatal multivitamin Tabs tablet Take 1 tablet by mouth daily at 12 noon.       Donette Larry, CNM 10/20/2019, 12:32 PM

## 2019-10-20 NOTE — MAU Note (Signed)
Been throwing up since 6p.m. last night. Now is yellow mucous stuff.  Did not take anything for it. No one else at home is sick.  Denies diarrhea or fever. Called OB, was told to come in. "bit of cramping"

## 2019-10-20 NOTE — MAU Note (Signed)
Pt reports vomiting since 1800 on 6/27, in total 4 episodes. Pt has not tried anything to relieve the nausea except for sleeping. Pt reports good fetal movement, no LOF and no bleeding

## 2019-10-23 DIAGNOSIS — Z419 Encounter for procedure for purposes other than remedying health state, unspecified: Secondary | ICD-10-CM | POA: Diagnosis not present

## 2019-10-24 ENCOUNTER — Other Ambulatory Visit: Payer: Self-pay | Admitting: Obstetrics & Gynecology

## 2019-10-24 DIAGNOSIS — O09292 Supervision of pregnancy with other poor reproductive or obstetric history, second trimester: Secondary | ICD-10-CM

## 2019-10-24 DIAGNOSIS — Z3A19 19 weeks gestation of pregnancy: Secondary | ICD-10-CM

## 2019-10-24 DIAGNOSIS — Z3689 Encounter for other specified antenatal screening: Secondary | ICD-10-CM

## 2019-10-24 DIAGNOSIS — E669 Obesity, unspecified: Secondary | ICD-10-CM

## 2019-10-28 ENCOUNTER — Encounter (HOSPITAL_COMMUNITY): Payer: Self-pay | Admitting: Obstetrics & Gynecology

## 2019-10-28 ENCOUNTER — Inpatient Hospital Stay (HOSPITAL_COMMUNITY)
Admission: AD | Admit: 2019-10-28 | Discharge: 2019-10-28 | Disposition: A | Discharge status: Home / Self Care | Payer: Medicaid Other | Attending: Obstetrics & Gynecology | Admitting: Obstetrics & Gynecology

## 2019-10-28 ENCOUNTER — Other Ambulatory Visit: Payer: Self-pay

## 2019-10-28 DIAGNOSIS — Z639 Problem related to primary support group, unspecified: Secondary | ICD-10-CM | POA: Diagnosis not present

## 2019-10-28 DIAGNOSIS — O99891 Other specified diseases and conditions complicating pregnancy: Secondary | ICD-10-CM | POA: Diagnosis not present

## 2019-10-28 DIAGNOSIS — O26892 Other specified pregnancy related conditions, second trimester: Secondary | ICD-10-CM | POA: Diagnosis not present

## 2019-10-28 DIAGNOSIS — Z79899 Other long term (current) drug therapy: Secondary | ICD-10-CM | POA: Insufficient documentation

## 2019-10-28 DIAGNOSIS — O23592 Infection of other part of genital tract in pregnancy, second trimester: Secondary | ICD-10-CM | POA: Diagnosis not present

## 2019-10-28 DIAGNOSIS — R102 Pelvic and perineal pain: Secondary | ICD-10-CM | POA: Diagnosis not present

## 2019-10-28 DIAGNOSIS — N760 Acute vaginitis: Secondary | ICD-10-CM

## 2019-10-28 DIAGNOSIS — B9689 Other specified bacterial agents as the cause of diseases classified elsewhere: Secondary | ICD-10-CM | POA: Diagnosis not present

## 2019-10-28 DIAGNOSIS — Z3A19 19 weeks gestation of pregnancy: Secondary | ICD-10-CM | POA: Diagnosis not present

## 2019-10-28 LAB — URINALYSIS, ROUTINE W REFLEX MICROSCOPIC
Bilirubin Urine: NEGATIVE
Glucose, UA: NEGATIVE mg/dL
Hgb urine dipstick: NEGATIVE
Ketones, ur: NEGATIVE mg/dL
Leukocytes,Ua: NEGATIVE
Nitrite: NEGATIVE
Protein, ur: NEGATIVE mg/dL
Specific Gravity, Urine: 1.017 (ref 1.005–1.030)
pH: 7 (ref 5.0–8.0)

## 2019-10-28 LAB — WET PREP, GENITAL
Sperm: NONE SEEN
Trich, Wet Prep: NONE SEEN
Yeast Wet Prep HPF POC: NONE SEEN

## 2019-10-28 MED ORDER — METRONIDAZOLE 500 MG PO TABS: 500.0000 mg | ORAL_TABLET | Freq: Two times a day (BID) | ORAL | 0 refills | Status: AC

## 2019-10-28 MED ORDER — PROMETHAZINE HCL 25 MG/ML IJ SOLN
25.0000 mg | Freq: Once | INTRAMUSCULAR | Status: AC
  Administered 2019-10-28: 25 mg via INTRAMUSCULAR
  Filled 2019-10-28: qty 1

## 2019-10-28 NOTE — MAU Provider Note (Addendum)
History     CSN: [id number]  Arrival date and time: 10/28/19 1497   First Provider Initiated Contact with Patient 10/28/19 1058      Chief Complaint  Patient presents with  . Abdominal Pain  . Nausea  . Emesis   [name] is a 24 y.o. year old G61P0101 female at [redacted]w[redacted]d weeks gestation who presents to MAU reporting lower abdominal pain that she describes as cramping and pressure since last night after a family conflict.  She also reports nausea vomiting since after the conflict occurred.  She has vomited 4 times in the last 24 hours; with the last being 530 this morning.  She has not taken any medication for pain or nausea and vomiting.  She denies vaginal bleeding or loss of fluid.  She does describe an increased amount of vaginal discharge with an odor.  She receives her prenatal care at [facility]; last appointment was October 24, 2019.  Her next appointment is late next week per the patient.   OB History    Gravida  2   Para  1   Term      Preterm  1   AB      Living  1     SAB      TAB      Ectopic      Multiple      Live Births  1           Past Medical History:  Diagnosis Date  . Medical history non-contributory     History reviewed. No pertinent surgical history.  History reviewed. No pertinent family history.  Social History   Tobacco Use  . Smoking status: Never Smoker  . Smokeless tobacco: Never Used  Vaping Use  . Vaping Use: Never used  Substance Use Topics  . Alcohol use: Not Currently  . Drug use: Never    Allergies: No Known Allergies  Medications Prior to Admission  Medication Sig Dispense Refill Last Dose  . ondansetron (ZOFRAN ODT) 4 MG disintegrating tablet Take 1 tablet (4 mg total) by mouth every 8 (eight) hours as needed for nausea or vomiting. 20 tablet 0 Past Week at Unknown time  . Prenatal Vit-Fe Fumarate-FA (PRENATAL MULTIVITAMIN) TABS tablet Take 1 tablet by mouth daily at 12 noon.   10/28/2019 at  Unknown time    Review of Systems  Constitutional: Negative.   HENT: Negative.   Eyes: Negative.   Respiratory: Negative.   Cardiovascular: Negative.   Gastrointestinal: Positive for nausea and vomiting.  Endocrine: Negative.   Genitourinary: Positive for pelvic pain.  Musculoskeletal: Negative.   Skin: Negative.   Allergic/Immunologic: Negative.   Neurological: Negative.   Hematological: Negative.   Psychiatric/Behavioral: Negative.    Physical Exam   Blood pressure 133/80, pulse 93, temperature 97.9 F (36.6 C), temperature source Oral, resp. rate 20, height 5\' 4"  (1.626 m), weight 128.6 kg, last menstrual period 07/10/2019, SpO2 100 %.  Physical Exam Constitutional:      Appearance: She is well-developed. She is obese.  HENT:     Head: Normocephalic and atraumatic.  Cardiovascular:     Rate and Rhythm: Normal rate.  Pulmonary:     Effort: Pulmonary effort is normal.  Abdominal:     Palpations: Abdomen is soft.     Tenderness: There is generalized abdominal tenderness.  Genitourinary:    Cervix: Normal.     Uterus: Enlarged.      Adnexa: Right adnexa normal and left  adnexa normal.     Comments: Uterus: gravid, SE: cervix is smooth, pink, no lesions, moderate amt of thin, malodorous, whitish-yellow vaginal d/c -- WP, GC/CT done, closed/long/firm, no CMT or friability, no adnexal tenderness  Skin:    General: Skin is warm and dry.  Neurological:     Mental Status: She is alert and oriented to person, place, and time.  Psychiatric:        Mood and Affect: Mood normal.        Behavior: Behavior normal.    FHTs by doppler: 153 bpm  MAU Course  Procedures  MDM Wet Prep GC/CT --- Results pending  Phenergan 25 mg IM injection -- no N/V; able to tolerate sips and chips  Results for orders placed or performed during the hospital encounter of 10/28/19 (from the past 24 hour(s))  Urinalysis, Routine w reflex microscopic     Status: None   Collection Time: 10/28/19  10:20 AM  Result Value Ref Range   Color, Urine YELLOW YELLOW   APPearance CLEAR CLEAR   Specific Gravity, Urine 1.017 1.005 - 1.030   pH 7.0 5.0 - 8.0   Glucose, UA NEGATIVE NEGATIVE mg/dL   Hgb urine dipstick NEGATIVE NEGATIVE   Bilirubin Urine NEGATIVE NEGATIVE   Ketones, ur NEGATIVE NEGATIVE mg/dL   Protein, ur NEGATIVE NEGATIVE mg/dL   Nitrite NEGATIVE NEGATIVE   Leukocytes,Ua NEGATIVE NEGATIVE  Wet prep, genital     Status: Abnormal   Collection Time: 10/28/19 11:10 AM   Specimen: Vaginal  Result Value Ref Range   Yeast Wet Prep HPF POC NONE SEEN NONE SEEN   Trich, Wet Prep NONE SEEN NONE SEEN   Clue Cells Wet Prep HPF POC PRESENT (A) NONE SEEN   WBC, Wet Prep HPF POC MANY (A) NONE SEEN   Sperm NONE SEEN     Assessment and Plan  Pelvic pain affecting pregnancy in second trimester, antepartum  - Information provided on pelvic pain in pregnancy - Advised that BV is more than likely the cause of her abd pain and pressure   Bacterial vaginosis  - Information provided on BV and alternative vaginal therapies - Rx for Flagyl 500 mg BID x 7 days   - Discharge patient - Keep scheduled appt with [facility] next week - Patient verbalized an understanding of the plan of care and agrees.     Raelyn Mora, MSN, CNM 10/28/2019, 10:58 AM

## 2019-10-28 NOTE — Discharge Instructions (Signed)
Alternative Vaginitis Therapies  1) soak in tub of warm water waist high with 1/2 cup of baking soda in water for ~ 20 mins. 2) soak 3 tampons in 1 tablespoon of fractionated (liquid form) coconut oil with 10 drops of Melaleuca (Tea Tree) essential oil, insert 1 saturated tampon vaginally at bedtime x 3 days. Both options are to be done after sexual intercourse, menses and when suspects Bacterial Vaginosis and/or yeast infection. Please be advised that these alternatives will not replace the need to be evaluated, if symptoms persist. You will need to seek care at an OB/GYN provider.  GO WHITE: Soap: UNSCENTED Dove (white box light green writing) Laundry detergent (underwear)- Dreft or [employer] unscented WHITE 100% cotton panties (NOT just cotton crouch) Sanitary napkin/panty liners: UNSCENTED.  If it doesn't SAY unscented it can have a scent/perfume    NO PERFUMES OR LOTIONS OR POTIONS in the vulvar area (may use regular [place]) Condoms: hypoallergenic only. Non dyed (no color) Toilet papers: white only Wash clothes: use a separate wash cloth. WHITE.  Wash in Dreft.   You can purchase Tea Tree Oil locally at:  [employer] [address] [place], [place] [address] [phone]  [employer] [address] [place], [place] [address] [phone]  

## 2019-10-28 NOTE — MAU Note (Signed)
Presents with c/o lower abdominal pain, states it's cramping and pressure.  Also reports N&V, vomited 4x in 24 hours.  Denies VB or LOF.  Hasn't takien any meds for pain or N&V.

## 2019-10-29 LAB — GC/CHLAMYDIA PROBE AMP (~~LOC~~) NOT AT ARMC
Chlamydia: NEGATIVE
Comment: NEGATIVE
Comment: NORMAL
Neisseria Gonorrhea: NEGATIVE

## 2019-11-03 ENCOUNTER — Ambulatory Visit: Payer: Medicaid Other | Attending: Obstetrics and Gynecology

## 2019-11-03 ENCOUNTER — Ambulatory Visit: Payer: Medicaid Other | Admitting: *Deleted

## 2019-11-03 ENCOUNTER — Other Ambulatory Visit: Payer: Self-pay | Admitting: *Deleted

## 2019-11-03 ENCOUNTER — Other Ambulatory Visit: Payer: Self-pay

## 2019-11-03 VITALS — BP 131/82 | HR 90

## 2019-11-03 DIAGNOSIS — E669 Obesity, unspecified: Secondary | ICD-10-CM | POA: Diagnosis not present

## 2019-11-03 DIAGNOSIS — O99212 Obesity complicating pregnancy, second trimester: Secondary | ICD-10-CM

## 2019-11-03 DIAGNOSIS — Z3A20 20 weeks gestation of pregnancy: Secondary | ICD-10-CM

## 2019-11-03 DIAGNOSIS — Z3689 Encounter for other specified antenatal screening: Secondary | ICD-10-CM | POA: Insufficient documentation

## 2019-11-03 DIAGNOSIS — Z3A19 19 weeks gestation of pregnancy: Secondary | ICD-10-CM | POA: Insufficient documentation

## 2019-11-03 DIAGNOSIS — Z362 Encounter for other antenatal screening follow-up: Secondary | ICD-10-CM

## 2019-11-03 DIAGNOSIS — O0990 Supervision of high risk pregnancy, unspecified, unspecified trimester: Secondary | ICD-10-CM

## 2019-11-03 DIAGNOSIS — Z363 Encounter for antenatal screening for malformations: Secondary | ICD-10-CM

## 2019-11-03 DIAGNOSIS — O09292 Supervision of pregnancy with other poor reproductive or obstetric history, second trimester: Secondary | ICD-10-CM | POA: Diagnosis not present

## 2019-11-03 DIAGNOSIS — O09212 Supervision of pregnancy with history of pre-term labor, second trimester: Secondary | ICD-10-CM

## 2019-11-11 DIAGNOSIS — Z363 Encounter for antenatal screening for malformations: Secondary | ICD-10-CM | POA: Diagnosis not present

## 2019-11-23 DIAGNOSIS — Z419 Encounter for procedure for purposes other than remedying health state, unspecified: Secondary | ICD-10-CM | POA: Diagnosis not present

## 2019-11-28 DIAGNOSIS — Q249 Congenital malformation of heart, unspecified: Secondary | ICD-10-CM | POA: Diagnosis not present

## 2019-11-28 DIAGNOSIS — Z3A23 23 weeks gestation of pregnancy: Secondary | ICD-10-CM | POA: Diagnosis not present

## 2019-11-28 DIAGNOSIS — O358XX0 Maternal care for other (suspected) fetal abnormality and damage, not applicable or unspecified: Secondary | ICD-10-CM | POA: Diagnosis not present

## 2019-12-01 ENCOUNTER — Ambulatory Visit: Payer: Medicaid Other | Attending: Obstetrics and Gynecology

## 2019-12-01 ENCOUNTER — Ambulatory Visit: Payer: Medicaid Other | Admitting: *Deleted

## 2019-12-01 ENCOUNTER — Other Ambulatory Visit: Payer: Self-pay

## 2019-12-01 ENCOUNTER — Encounter: Payer: Self-pay | Admitting: *Deleted

## 2019-12-01 VITALS — BP 116/73 | HR 110

## 2019-12-01 DIAGNOSIS — Z8751 Personal history of pre-term labor: Secondary | ICD-10-CM | POA: Diagnosis not present

## 2019-12-01 DIAGNOSIS — O09212 Supervision of pregnancy with history of pre-term labor, second trimester: Secondary | ICD-10-CM | POA: Diagnosis not present

## 2019-12-01 DIAGNOSIS — O99212 Obesity complicating pregnancy, second trimester: Secondary | ICD-10-CM | POA: Diagnosis not present

## 2019-12-01 DIAGNOSIS — Z362 Encounter for other antenatal screening follow-up: Secondary | ICD-10-CM | POA: Insufficient documentation

## 2019-12-01 DIAGNOSIS — Z3A24 24 weeks gestation of pregnancy: Secondary | ICD-10-CM | POA: Diagnosis not present

## 2019-12-01 DIAGNOSIS — O09292 Supervision of pregnancy with other poor reproductive or obstetric history, second trimester: Secondary | ICD-10-CM

## 2019-12-02 ENCOUNTER — Other Ambulatory Visit: Payer: Self-pay | Admitting: *Deleted

## 2019-12-02 DIAGNOSIS — Z6841 Body Mass Index (BMI) 40.0 and over, adult: Secondary | ICD-10-CM

## 2019-12-09 DIAGNOSIS — R809 Proteinuria, unspecified: Secondary | ICD-10-CM | POA: Diagnosis not present

## 2019-12-09 DIAGNOSIS — Z1152 Encounter for screening for COVID-19: Secondary | ICD-10-CM | POA: Diagnosis not present

## 2019-12-24 DIAGNOSIS — Z419 Encounter for procedure for purposes other than remedying health state, unspecified: Secondary | ICD-10-CM | POA: Diagnosis not present

## 2019-12-30 ENCOUNTER — Ambulatory Visit: Payer: Medicaid Other | Attending: Obstetrics and Gynecology

## 2019-12-30 ENCOUNTER — Ambulatory Visit: Payer: Medicaid Other

## 2020-01-05 DIAGNOSIS — Z8759 Personal history of other complications of pregnancy, childbirth and the puerperium: Secondary | ICD-10-CM | POA: Diagnosis not present

## 2020-01-05 DIAGNOSIS — Z3A28 28 weeks gestation of pregnancy: Secondary | ICD-10-CM | POA: Diagnosis not present

## 2020-01-05 DIAGNOSIS — Z3493 Encounter for supervision of normal pregnancy, unspecified, third trimester: Secondary | ICD-10-CM | POA: Diagnosis not present

## 2020-01-05 DIAGNOSIS — O99212 Obesity complicating pregnancy, second trimester: Secondary | ICD-10-CM | POA: Diagnosis not present

## 2020-01-08 DIAGNOSIS — Z3493 Encounter for supervision of normal pregnancy, unspecified, third trimester: Secondary | ICD-10-CM | POA: Diagnosis not present

## 2020-01-19 DIAGNOSIS — Z3493 Encounter for supervision of normal pregnancy, unspecified, third trimester: Secondary | ICD-10-CM | POA: Diagnosis not present

## 2020-01-19 DIAGNOSIS — Z3A30 30 weeks gestation of pregnancy: Secondary | ICD-10-CM | POA: Diagnosis not present

## 2020-01-19 DIAGNOSIS — Z0183 Encounter for blood typing: Secondary | ICD-10-CM | POA: Diagnosis not present

## 2020-01-19 DIAGNOSIS — O99213 Obesity complicating pregnancy, third trimester: Secondary | ICD-10-CM | POA: Diagnosis not present

## 2020-01-23 DIAGNOSIS — Z419 Encounter for procedure for purposes other than remedying health state, unspecified: Secondary | ICD-10-CM | POA: Diagnosis not present

## 2020-01-30 ENCOUNTER — Inpatient Hospital Stay (HOSPITAL_COMMUNITY)
Admission: AD | Admit: 2020-01-30 | Discharge: 2020-01-30 | Disposition: A | Discharge status: Home / Self Care | Payer: Medicaid Other | Attending: Obstetrics & Gynecology | Admitting: Obstetrics & Gynecology

## 2020-01-30 ENCOUNTER — Other Ambulatory Visit: Payer: Self-pay

## 2020-01-30 ENCOUNTER — Encounter (HOSPITAL_COMMUNITY): Payer: Self-pay | Admitting: Obstetrics & Gynecology

## 2020-01-30 DIAGNOSIS — O10913 Unspecified pre-existing hypertension complicating pregnancy, third trimester: Secondary | ICD-10-CM | POA: Insufficient documentation

## 2020-01-30 DIAGNOSIS — R102 Pelvic and perineal pain: Secondary | ICD-10-CM | POA: Diagnosis not present

## 2020-01-30 DIAGNOSIS — O24419 Gestational diabetes mellitus in pregnancy, unspecified control: Secondary | ICD-10-CM | POA: Insufficient documentation

## 2020-01-30 DIAGNOSIS — O99353 Diseases of the nervous system complicating pregnancy, third trimester: Secondary | ICD-10-CM | POA: Insufficient documentation

## 2020-01-30 DIAGNOSIS — Z3A33 33 weeks gestation of pregnancy: Secondary | ICD-10-CM | POA: Insufficient documentation

## 2020-01-30 DIAGNOSIS — Z7982 Long term (current) use of aspirin: Secondary | ICD-10-CM | POA: Insufficient documentation

## 2020-01-30 DIAGNOSIS — O26893 Other specified pregnancy related conditions, third trimester: Secondary | ICD-10-CM

## 2020-01-30 DIAGNOSIS — Z3689 Encounter for other specified antenatal screening: Secondary | ICD-10-CM

## 2020-01-30 DIAGNOSIS — R519 Headache, unspecified: Secondary | ICD-10-CM | POA: Diagnosis not present

## 2020-01-30 LAB — CBC
HCT: 34.2 % — ABNORMAL LOW (ref 36.0–46.0)
Hemoglobin: 10.6 g/dL — ABNORMAL LOW (ref 12.0–15.0)
MCH: 23.3 pg — ABNORMAL LOW (ref 26.0–34.0)
MCHC: 31 g/dL (ref 30.0–36.0)
MCV: 75.3 fL — ABNORMAL LOW (ref 80.0–100.0)
Platelets: 320 10*3/uL (ref 150–400)
RBC: 4.54 MIL/uL (ref 3.87–5.11)
RDW: 14.7 % (ref 11.5–15.5)
WBC: 8.3 10*3/uL (ref 4.0–10.5)
nRBC: 0 % (ref 0.0–0.2)

## 2020-01-30 LAB — URINALYSIS, ROUTINE W REFLEX MICROSCOPIC
Bilirubin Urine: NEGATIVE
Glucose, UA: NEGATIVE mg/dL
Hgb urine dipstick: NEGATIVE
Ketones, ur: NEGATIVE mg/dL
Leukocytes,Ua: NEGATIVE
Nitrite: NEGATIVE
Protein, ur: NEGATIVE mg/dL
Specific Gravity, Urine: 1.013 (ref 1.005–1.030)
pH: 8 (ref 5.0–8.0)

## 2020-01-30 LAB — COMPREHENSIVE METABOLIC PANEL
ALT: 16 U/L (ref 0–44)
AST: 15 U/L (ref 15–41)
Albumin: 2.7 g/dL — ABNORMAL LOW (ref 3.5–5.0)
Alkaline Phosphatase: 60 U/L (ref 38–126)
Anion gap: 9 (ref 5–15)
BUN: 5 mg/dL — ABNORMAL LOW (ref 6–20)
CO2: 22 mmol/L (ref 22–32)
Calcium: 9.3 mg/dL (ref 8.9–10.3)
Chloride: 104 mmol/L (ref 98–111)
Creatinine, Ser: 0.47 mg/dL (ref 0.44–1.00)
GFR, Estimated: >60 mL/min (ref >60)
Glucose, Bld: 89 mg/dL (ref 70–99)
Potassium: 4.3 mmol/L (ref 3.5–5.1)
Sodium: 135 mmol/L (ref 135–145)
Total Bilirubin: <0.1 mg/dL — ABNORMAL LOW (ref 0.3–1.2)
Total Protein: 6.3 g/dL — ABNORMAL LOW (ref 6.5–8.1)

## 2020-01-30 LAB — WET PREP, GENITAL
Clue Cells Wet Prep HPF POC: NONE SEEN
Sperm: NONE SEEN
Trich, Wet Prep: NONE SEEN
Yeast Wet Prep HPF POC: NONE SEEN

## 2020-01-30 MED ORDER — COMFORT FIT MATERNITY SUPP LG MISC
1.0000 [IU] | Freq: Every day | 0 refills | Status: DC
Start: 2020-01-30 — End: 2020-03-20

## 2020-01-30 MED ORDER — MAG-OXIDE 200 MG PO TABS: 400.0000 mg | ORAL_TABLET | Freq: Every day | ORAL | 3 refills | Status: DC

## 2020-01-30 NOTE — MAU Note (Signed)
Dana Solomon is a 24 y.o. at 108w4d here in MAU reporting: has noticed some increased swelling in hands and feet. Also having migraine sometimes. Denies visual changes. Having some lower abdominal cramping. No bleeding or LOF. +FM  Onset of complaint: ongoing  Pain score: 8/10  Vitals:   01/30/20 1234  BP: (!) 132/53  Pulse: 91  Resp: 16  Temp: 98.8 F (37.1 C)  SpO2: 100%     FHT: +FM EFM applied in room  Lab orders placed from triage: UA

## 2020-01-30 NOTE — Discharge Instructions (Signed)

## 2020-02-01 LAB — GC/CHLAMYDIA PROBE AMP (~~LOC~~) NOT AT ARMC
Chlamydia: NEGATIVE
Comment: NEGATIVE
Comment: NORMAL
Neisseria Gonorrhea: NEGATIVE

## 2020-02-02 DIAGNOSIS — Z3483 Encounter for supervision of other normal pregnancy, third trimester: Secondary | ICD-10-CM | POA: Diagnosis not present

## 2020-02-02 DIAGNOSIS — O99213 Obesity complicating pregnancy, third trimester: Secondary | ICD-10-CM | POA: Diagnosis not present

## 2020-02-02 DIAGNOSIS — Z3A32 32 weeks gestation of pregnancy: Secondary | ICD-10-CM | POA: Diagnosis not present

## 2020-02-05 NOTE — MAU Provider Note (Signed)
Chief Complaint:  Abdominal Pain   First Provider Initiated Contact with Patient 01/30/20 1248     HPI: Dana Solomon is a 24 y.o. G2P0101 at [redacted]w[redacted]d who presents to maternity admissions reporting mild to moderate diffuse lower abdominal cramping and mild swelling in her hands and feet. She says the cramping was worse earlier but has gotten better since arrival to MAU. The pain is aggravated by sharp movements, trying to turn over in bed, etc. She has been having migraines (none now) and generally feeling tired and "off". . Denies vaginal bleeding, leaking of fluid, decreased fetal movement, fever, falls, or recent illness.   Past Medical History:  Diagnosis Date  . Gestational diabetes   . Hypertension   . Medical history non-contributory   . Pregnancy induced hypertension    OB History  Gravida Para Term Preterm AB Living  2 1   1   1   SAB TAB Ectopic Multiple Live Births          1    # Outcome Date GA Lbr Len/2nd Weight Sex Delivery Anes PTL Lv  2 Current           1 Preterm 2014 [redacted]w[redacted]d    Vag-Spont      Past Surgical History:  Procedure Laterality Date  . Belly button      approx 10 years ago   History reviewed. No pertinent family history. Social History   Tobacco Use  . Smoking status: Never Smoker  . Smokeless tobacco: Never Used  Vaping Use  . Vaping Use: Never used  Substance Use Topics  . Alcohol use: Not Currently  . Drug use: Never   No Known Allergies No medications prior to admission.    I have reviewed patient's Past Medical Hx, Surgical Hx, Family Hx, Social Hx, medications and allergies.   ROS:  Review of Systems  Constitutional: Positive for fatigue. Negative for fever.  HENT: Negative for congestion, sinus pressure and sore throat.   Eyes: Negative for photophobia and visual disturbance.  Respiratory: Negative for cough and shortness of breath.   Gastrointestinal: Negative for nausea and vomiting.  Genitourinary: Positive for pelvic pain.  Negative for flank pain, vaginal bleeding and vaginal discharge.  Neurological: Positive for headaches. Negative for dizziness and syncope.  All other systems reviewed and are negative.   Physical Exam   Vitals:   01/30/20 1234 01/30/20 1539  BP: (!) 132/53 130/61  Pulse: 91 92  Resp: 16 17  Temp: 98.8 F (37.1 C)   SpO2: 100% 100%   Constitutional: Well-developed, well-nourished female in no acute distress.  Cardiovascular: normal rate & rhythm, no murmur Respiratory: normal effort, lung sounds clear throughout GI: Abd soft, non-tender, gravid appropriate for gestational age. Pos BS x 4 MS: Extremities nontender, bilateral +1 dependent edema in legs, some mild puffiness in both hands, normal ROM Neurologic: Alert and oriented x 4.  Pelvic: NEFG, physiologic discharge, no blood, cervix clean.   Dilation: Closed Exam by:: Vinessa Macconnell cnm  Fetal Tracing: reactive Baseline: 135 Variability: moderate Accelerations: present Decelerations: none Toco: UI   Labs: UA - normal Wet prep - normal GC/CT - negative CBC showed mild iron deficiency anemia classic of this stage in pregnancy CMP normal  Imaging:  No results found.  MAU Course: Orders Placed This Encounter  Procedures  . Wet prep, genital  . Urinalysis, Routine w reflex microscopic Urine, Clean Catch  . CBC  . Comprehensive metabolic panel  . Discharge patient   Meds ordered  this encounter  Medications  . Elastic Bandages & Supports (COMFORT FIT MATERNITY SUPP LG) MISC    Sig: 1 Units by Does not apply route daily.    Dispense:  1 each    Refill:  0    Order Specific Question:   Supervising Provider    Answer:   Reva Bores [[id number]]  . Magnesium Oxide (MAG-OXIDE) 200 MG TABS    Sig: Take 2 tablets (400 mg total) by mouth at bedtime. If that amount causes loose stools in the am, switch to 200mg  daily at bedtime.    Dispense:  60 tablet    Refill:  3    Order Specific Question:   Supervising Provider     Answer:      MDM: Wet prep normal, no abnormal discharge or bleeding noted on pelvic exam NST reactive with good movement of baby easily palpated Pain consistent with round ligament pains - pt amenable to magnesium at night for muscle pain and migraine prevention as well as trying a pregnancy belt Encouraged pt to hydrate well, eat small protein rich meals and use exercise like walking and yoga to prevent/ease common 3rd trimester pregnancy complaints  Assessment: 1. Pelvic pain in pregnancy, antepartum, third trimester   2. Headache in pregnancy, antepartum, third trimester   3. NST (non-stress test) reactive    Plan: Discharge home in stable condition with preterm labor precautions     Follow-up Information    [facility] Go to.   Specialty: Obstetrics and Gynecology Why: as scheduled for ongoing prenatal care Contact information: [address] [place] [phone]               Allergies as of 01/30/2020   No Known Allergies     Medication List    TAKE these medications   aspirin EC 81 MG tablet Take 81 mg by mouth daily. Swallow whole.   Comfort Fit Maternity Supp Lg Misc 1 Units by Does not apply route daily.   Mag-Oxide 200 MG Tabs Generic drug: Magnesium Oxide Take 2 tablets (400 mg total) by mouth at bedtime. If that amount causes loose stools in the am, switch to 200mg  daily at bedtime.   ondansetron 4 MG disintegrating tablet Commonly known as: Zofran ODT Take 1 tablet (4 mg total) by mouth every 8 (eight) hours as needed for nausea or vomiting.   prenatal multivitamin Tabs tablet Take 1 tablet by mouth daily at 12 noon.      03/31/2020, CNM, MSN, [facility] 01/30/20  1630

## 2020-02-12 DIAGNOSIS — O99213 Obesity complicating pregnancy, third trimester: Secondary | ICD-10-CM | POA: Diagnosis not present

## 2020-02-12 DIAGNOSIS — Z3A34 34 weeks gestation of pregnancy: Secondary | ICD-10-CM | POA: Diagnosis not present

## 2020-02-18 DIAGNOSIS — Z3A34 34 weeks gestation of pregnancy: Secondary | ICD-10-CM | POA: Diagnosis not present

## 2020-02-18 DIAGNOSIS — Z8759 Personal history of other complications of pregnancy, childbirth and the puerperium: Secondary | ICD-10-CM | POA: Diagnosis not present

## 2020-02-18 DIAGNOSIS — Z3483 Encounter for supervision of other normal pregnancy, third trimester: Secondary | ICD-10-CM | POA: Diagnosis not present

## 2020-02-18 DIAGNOSIS — O99213 Obesity complicating pregnancy, third trimester: Secondary | ICD-10-CM | POA: Diagnosis not present

## 2020-02-23 DIAGNOSIS — Z419 Encounter for procedure for purposes other than remedying health state, unspecified: Secondary | ICD-10-CM | POA: Diagnosis not present

## 2020-02-25 DIAGNOSIS — O99213 Obesity complicating pregnancy, third trimester: Secondary | ICD-10-CM | POA: Diagnosis not present

## 2020-02-25 DIAGNOSIS — Z3A35 35 weeks gestation of pregnancy: Secondary | ICD-10-CM | POA: Diagnosis not present

## 2020-03-03 DIAGNOSIS — Z3483 Encounter for supervision of other normal pregnancy, third trimester: Secondary | ICD-10-CM | POA: Diagnosis not present

## 2020-03-03 DIAGNOSIS — O99213 Obesity complicating pregnancy, third trimester: Secondary | ICD-10-CM | POA: Diagnosis not present

## 2020-03-03 DIAGNOSIS — Z3A36 36 weeks gestation of pregnancy: Secondary | ICD-10-CM | POA: Diagnosis not present

## 2020-03-03 LAB — OB RESULTS CONSOLE GBS: GBS: POSITIVE

## 2020-03-04 ENCOUNTER — Other Ambulatory Visit: Payer: Self-pay

## 2020-03-04 ENCOUNTER — Encounter (HOSPITAL_COMMUNITY): Payer: Self-pay | Admitting: Obstetrics & Gynecology

## 2020-03-04 ENCOUNTER — Inpatient Hospital Stay (HOSPITAL_COMMUNITY)
Admission: AD | Admit: 2020-03-04 | Discharge: 2020-03-04 | Disposition: A | Discharge status: Home / Self Care | Payer: Medicaid Other | Attending: Obstetrics & Gynecology | Admitting: Obstetrics & Gynecology

## 2020-03-04 DIAGNOSIS — G43909 Migraine, unspecified, not intractable, without status migrainosus: Secondary | ICD-10-CM | POA: Insufficient documentation

## 2020-03-04 DIAGNOSIS — Z3A37 37 weeks gestation of pregnancy: Secondary | ICD-10-CM

## 2020-03-04 DIAGNOSIS — O99513 Diseases of the respiratory system complicating pregnancy, third trimester: Secondary | ICD-10-CM | POA: Diagnosis not present

## 2020-03-04 DIAGNOSIS — Z7982 Long term (current) use of aspirin: Secondary | ICD-10-CM | POA: Diagnosis not present

## 2020-03-04 DIAGNOSIS — O212 Late vomiting of pregnancy: Secondary | ICD-10-CM | POA: Diagnosis not present

## 2020-03-04 DIAGNOSIS — R519 Headache, unspecified: Secondary | ICD-10-CM | POA: Diagnosis not present

## 2020-03-04 DIAGNOSIS — Z3689 Encounter for other specified antenatal screening: Secondary | ICD-10-CM | POA: Diagnosis not present

## 2020-03-04 DIAGNOSIS — O26893 Other specified pregnancy related conditions, third trimester: Secondary | ICD-10-CM | POA: Diagnosis not present

## 2020-03-04 DIAGNOSIS — O99353 Diseases of the nervous system complicating pregnancy, third trimester: Secondary | ICD-10-CM | POA: Diagnosis not present

## 2020-03-04 DIAGNOSIS — J029 Acute pharyngitis, unspecified: Secondary | ICD-10-CM | POA: Diagnosis not present

## 2020-03-04 DIAGNOSIS — Z20822 Contact with and (suspected) exposure to covid-19: Secondary | ICD-10-CM | POA: Insufficient documentation

## 2020-03-04 LAB — PROTEIN / CREATININE RATIO, URINE
Creatinine, Urine: 143.75 mg/dL
Protein Creatinine Ratio: 0.2 mg/mg{Cre} — ABNORMAL HIGH (ref 0.00–0.15)
Total Protein, Urine: 29 mg/dL

## 2020-03-04 LAB — COMPREHENSIVE METABOLIC PANEL
ALT: 18 U/L (ref 0–44)
AST: 16 U/L (ref 15–41)
Albumin: 2.7 g/dL — ABNORMAL LOW (ref 3.5–5.0)
Alkaline Phosphatase: 79 U/L (ref 38–126)
Anion gap: 10 (ref 5–15)
BUN: <5 mg/dL — ABNORMAL LOW (ref 6–20)
CO2: 21 mmol/L — ABNORMAL LOW (ref 22–32)
Calcium: 9.2 mg/dL (ref 8.9–10.3)
Chloride: 105 mmol/L (ref 98–111)
Creatinine, Ser: 0.56 mg/dL (ref 0.44–1.00)
GFR, Estimated: >60 mL/min (ref >60)
Glucose, Bld: 84 mg/dL (ref 70–99)
Potassium: 3.6 mmol/L (ref 3.5–5.1)
Sodium: 136 mmol/L (ref 135–145)
Total Bilirubin: 0.5 mg/dL (ref 0.3–1.2)
Total Protein: 6.5 g/dL (ref 6.5–8.1)

## 2020-03-04 LAB — RESPIRATORY PANEL BY RT PCR (FLU A&B, COVID)
Influenza A by PCR: NEGATIVE
Influenza B by PCR: NEGATIVE
SARS Coronavirus 2 by RT PCR: NEGATIVE

## 2020-03-04 LAB — CBC
HCT: 34.9 % — ABNORMAL LOW (ref 36.0–46.0)
Hemoglobin: 11 g/dL — ABNORMAL LOW (ref 12.0–15.0)
MCH: 23.7 pg — ABNORMAL LOW (ref 26.0–34.0)
MCHC: 31.5 g/dL (ref 30.0–36.0)
MCV: 75.2 fL — ABNORMAL LOW (ref 80.0–100.0)
Platelets: 274 K/uL (ref 150–400)
RBC: 4.64 MIL/uL (ref 3.87–5.11)
RDW: 14.7 % (ref 11.5–15.5)
WBC: 10.9 K/uL — ABNORMAL HIGH (ref 4.0–10.5)
nRBC: 0 % (ref 0.0–0.2)

## 2020-03-04 LAB — URINALYSIS, ROUTINE W REFLEX MICROSCOPIC
Bacteria, UA: NONE SEEN
Bilirubin Urine: NEGATIVE
Glucose, UA: NEGATIVE mg/dL
Hgb urine dipstick: NEGATIVE
Ketones, ur: NEGATIVE mg/dL
Leukocytes,Ua: NEGATIVE
Nitrite: NEGATIVE
Protein, ur: 30 mg/dL — AB
Specific Gravity, Urine: 1.01 (ref 1.005–1.030)
pH: 7 (ref 5.0–8.0)

## 2020-03-04 MED ORDER — DEXAMETHASONE SODIUM PHOSPHATE 10 MG/ML IJ SOLN
10.0000 mg | Freq: Once | INTRAMUSCULAR | Status: AC
  Administered 2020-03-04: 10 mg via INTRAVENOUS
  Filled 2020-03-04: qty 1

## 2020-03-04 MED ORDER — DIPHENHYDRAMINE HCL 50 MG/ML IJ SOLN
12.5000 mg | Freq: Once | INTRAMUSCULAR | Status: AC
  Administered 2020-03-04: 12.5 mg via INTRAVENOUS
  Filled 2020-03-04: qty 1

## 2020-03-04 MED ORDER — METOCLOPRAMIDE HCL 5 MG/ML IJ SOLN
10.0000 mg | Freq: Once | INTRAMUSCULAR | Status: AC
  Administered 2020-03-04: 10 mg via INTRAVENOUS
  Filled 2020-03-04: qty 2

## 2020-03-04 MED ORDER — LACTATED RINGERS IV BOLUS
1000.0000 mL | Freq: Once | INTRAVENOUS | Status: AC
  Administered 2020-03-04: 1000 mL via INTRAVENOUS

## 2020-03-04 MED ORDER — PHENOL 1.4 % MT LIQD
1.0000 | OROMUCOSAL | Status: DC | PRN
  Administered 2020-03-04: 1 via OROMUCOSAL
  Filled 2020-03-04: qty 177

## 2020-03-04 NOTE — MAU Note (Signed)
Presents with c/o sore throat, H/A and N/V.  Reports has vomites 3x since last night.  Unable to eat secondary sore throat, pushing fluids.  States took Tylenol twice for H/A yesterday, minimal relief noted.  Denies LOF or VB.  Endorses +FM.

## 2020-03-04 NOTE — MAU Provider Note (Signed)
History     CSN: [id number]  Arrival date and time: 03/04/20 [id number]   First Provider Initiated Contact with Patient 03/04/20 1057      Chief Complaint  Patient presents with  . Headache  . Sore Throat  . Nausea  . Emesis   [name] is a 24 y.o. G2P0101 at 1433w3d who presents to MAU for sore throat x2 days with HA, body aches for the same duration. Pt has not been vaccinated against COVID and denies known exposure. Patient denies anything that makes it better and reports trying to eat or drink makes it worse. Patient reports she took Tylenol 2 days ago and reports she is unsure how much she took but reports it did not really work. Patient also reports she vomiting one time last night and once this morning. Patient reports she was at her OB office yesterday and told them about these symptoms, but reports they looked in her throat and did not see anything, and patient was instructed to come to MAU if symptoms worsened. Patient reports she does have allergies, but reports she did not try taking any medication at home. Patient rates HA as 9/10. Patient reports she always gets a migraine headache only when she has a sore throat.  Pt denies VB, LOF, ctx, decreased FM, vaginal discharge/odor/itching. Pt denies nausea, abdominal pain, constipation, diarrhea, or urinary problems. Pt denies fever, chills, fatigue, sweating or changes in appetite. Pt denies SOB or chest pain. Pt denies dizziness, light-headedness, weakness.  Problems this pregnancy include: none - pt reports fetal echo was done as the view of the heart could not be visualized on anatomy scan. Allergies? NKDA Current medications/supplements? Magnesium (sciatic pain), zofran (last took yesterday), PNVs Prenatal care provider? [facility], next appt 03/11/2020   OB History    Gravida  2   Para  1   Term      Preterm  1   AB      Living  1     SAB      TAB      Ectopic      Multiple      Live Births  1            Past Medical History:  Diagnosis Date  . Gestational diabetes   . Hypertension   . Medical history non-contributory   . Pregnancy induced hypertension     Past Surgical History:  Procedure Laterality Date  . Belly button      approx 10 years ago  . [employer]     age [age]y    No family history on file.  Social History   Tobacco Use  . Smoking status: Never Smoker  . Smokeless tobacco: Never Used  Vaping Use  . Vaping Use: Never used  Substance Use Topics  . Alcohol use: Not Currently  . Drug use: Never    Allergies: No Known Allergies  No medications prior to admission.    Review of Systems  Constitutional: Positive for chills. Negative for diaphoresis, fatigue and fever.  HENT: Positive for sore throat.   Eyes: Negative for visual disturbance.  Respiratory: Negative for shortness of breath.   Cardiovascular: Negative for chest pain.  Gastrointestinal: Positive for vomiting. Negative for abdominal pain, constipation, diarrhea and nausea.  Genitourinary: Negative for dysuria, flank pain, frequency, pelvic pain, urgency, vaginal bleeding and vaginal discharge.  Neurological: Positive for headaches. Negative for dizziness, weakness and light-headedness.   Physical Exam   Blood pressure 129/70, pulse Marland Kitchen(!)  116, temperature 99.4 F (37.4 C), temperature source Oral, resp. rate 20, height 5' 4.5" (1.638 m), weight 133.7 kg, last menstrual period 06/23/2019, SpO2 100 %.  Patient Vitals for the past 24 hrs:  BP Temp Temp src Pulse Resp SpO2 Height Weight  03/04/20 1548 129/70 -- -- -- -- -- -- --  03/04/20 0951 136/78 99.4 F (37.4 C) Oral (!) 116 20 100 % -- --  03/04/20 0943 -- -- -- -- -- -- 5' 4.5" (1.638 m) 133.7 kg   Physical Exam Vitals and nursing note reviewed.  Constitutional:      General: She is not in acute distress.    Appearance: Normal appearance. She is not ill-appearing, toxic-appearing or diaphoretic.  HENT:     Head: Normocephalic and  atraumatic.     Mouth/Throat:     Pharynx: Uvula midline. No pharyngeal swelling, oropharyngeal exudate, posterior oropharyngeal erythema or uvula swelling.     Tonsils: No tonsillar exudate or tonsillar abscesses.  Pulmonary:     Effort: Pulmonary effort is normal.  Neurological:     Mental Status: She is alert and oriented to person, place, and time.  Psychiatric:        Mood and Affect: Mood normal.        Behavior: Behavior normal.        Thought Content: Thought content normal.        Judgment: Judgment normal.    Results for orders placed or performed during the hospital encounter of 03/04/20 (from the past 24 hour(s))  Respiratory Panel by RT PCR (Flu A&B, Covid) - Nasopharyngeal Swab     Status: None   Collection Time: 03/04/20 10:56 AM   Specimen: Nasopharyngeal Swab  Result Value Ref Range   SARS Coronavirus 2 by RT PCR NEGATIVE NEGATIVE   Influenza A by PCR NEGATIVE NEGATIVE   Influenza B by PCR NEGATIVE NEGATIVE  Urinalysis, Routine w reflex microscopic Urine, Clean Catch     Status: Abnormal   Collection Time: 03/04/20 11:00 AM  Result Value Ref Range   Color, Urine YELLOW YELLOW   APPearance HAZY (A) CLEAR   Specific Gravity, Urine 1.010 1.005 - 1.030   pH 7.0 5.0 - 8.0   Glucose, UA NEGATIVE NEGATIVE mg/dL   Hgb urine dipstick NEGATIVE NEGATIVE   Bilirubin Urine NEGATIVE NEGATIVE   Ketones, ur NEGATIVE NEGATIVE mg/dL   Protein, ur 30 (A) NEGATIVE mg/dL   Nitrite NEGATIVE NEGATIVE   Leukocytes,Ua NEGATIVE NEGATIVE   RBC / HPF 0-5 0 - 5 RBC/hpf   WBC, UA 0-5 0 - 5 WBC/hpf   Bacteria, UA NONE SEEN NONE SEEN   Squamous Epithelial / LPF 6-10 0 - 5  Protein / creatinine ratio, urine     Status: Abnormal   Collection Time: 03/04/20 11:00 AM  Result Value Ref Range   Creatinine, Urine 143.75 mg/dL   Total Protein, Urine 29 mg/dL   Protein Creatinine Ratio 0.20 (H) 0.00 - 0.15 mg/mg[Cre]  CBC     Status: Abnormal   Collection Time: 03/04/20 12:29 PM  Result  Value Ref Range   WBC 10.9 (H) 4.0 - 10.5 K/uL   RBC 4.64 3.87 - 5.11 MIL/uL   Hemoglobin 11.0 (L) 12.0 - 15.0 g/dL   HCT 93.7 (L) 36 - 46 %   MCV 75.2 (L) 80.0 - 100.0 fL   MCH 23.7 (L) 26.0 - 34.0 pg   MCHC 31.5 30.0 - 36.0 g/dL   RDW 34.2 87.6 - 81.1 %  Platelets 274 150 - 400 K/uL   nRBC 0.0 0.0 - 0.2 %  Comprehensive metabolic panel     Status: Abnormal   Collection Time: 03/04/20 12:29 PM  Result Value Ref Range   Sodium 136 135 - 145 mmol/L   Potassium 3.6 3.5 - 5.1 mmol/L   Chloride 105 98 - 111 mmol/L   CO2 21 (L) 22 - 32 mmol/L   Glucose, Bld 84 70 - 99 mg/dL   BUN <5 (L) 6 - 20 mg/dL   Creatinine, Ser 2.42 0.44 - 1.00 mg/dL   Calcium 9.2 8.9 - 35.3 mg/dL   Total Protein 6.5 6.5 - 8.1 g/dL   Albumin 2.7 (L) 3.5 - 5.0 g/dL   AST 16 15 - 41 U/L   ALT 18 0 - 44 U/L   Alkaline Phosphatase 79 38 - 126 U/L   Total Bilirubin 0.5 0.3 - 1.2 mg/dL   GFR, Estimated >61 >44 mL/min   Anion gap 10 5 - 15   No results found.  MAU Course  Procedures  MDM -URI symptoms with vomiting x2, HA 9/10 -temp 99.4 without meds -UA: hazy/30PRO -CBC: WNL -CMP: WNL -PCr: 0.20 -COVID/Flu: negative -Group A Strep collected -HA cocktail given, pt reports HA now 0/10 -EFM: reactive       -baseline: 135-150       -variability: moderate       -accels: present, 15x15       -decels: absent       -TOCO: few, irregular ctx, pt reports not feeling -consulted with Dr. Crissie Reese who reviewed patient tracing -pt discharged to home in stable condition  Orders Placed This Encounter  Procedures  . Respiratory Panel by RT PCR (Flu A&B, Covid) - Nasopharyngeal Swab    Standing Status:   Standing    Number of Occurrences:   1    Order Specific Question:   Is this test for diagnosis or screening    Answer:   Screening    Order Specific Question:   Symptomatic for COVID-19 as defined by CDC    Answer:   No    Order Specific Question:   Hospitalized for COVID-19    Answer:   No    Order  Specific Question:   Admitted to ICU for COVID-19    Answer:   No    Order Specific Question:   Previously tested for COVID-19    Answer:   No    Order Specific Question:   Resident in a congregate (group) care setting    Answer:   Yes    Order Specific Question:   Employed in healthcare setting    Answer:   No    Order Specific Question:   Pregnant    Answer:   Yes    Order Specific Question:   Has patient completed COVID vaccination(s) (2 doses of Pfizer/Moderna 1 dose of [employer])    Answer:   No  . Culture, group A strep    Standing Status:   Standing    Number of Occurrences:   1  . Urinalysis, Routine w reflex microscopic Urine, Clean Catch    Standing Status:   Standing    Number of Occurrences:   1  . CBC    Standing Status:   Standing    Number of Occurrences:   1  . Comprehensive metabolic panel    Standing Status:   Standing    Number of Occurrences:   1  .  Protein / creatinine ratio, urine    Standing Status:   Standing    Number of Occurrences:   1  . Insert peripheral IV    Standing Status:   Standing    Number of Occurrences:   1  . Discharge patient    Order Specific Question:   Discharge disposition    Answer:   01-Home or Self Care [1]    Order Specific Question:   Discharge patient date    Answer:   03/04/2020   Meds ordered this encounter  Medications  . lactated ringers bolus 1,000 mL  . diphenhydrAMINE (BENADRYL) injection 12.5 mg  . dexamethasone (DECADRON) injection 10 mg  . metoCLOPramide (REGLAN) injection 10 mg  . phenol (CHLORASEPTIC) mouth spray 1 spray    Assessment and Plan   1. Pregnancy headache in third trimester   2. [redacted] weeks gestation of pregnancy   3. NST (non-stress test) reactive   4. Sore throat     Allergies as of 03/04/2020   No Known Allergies     Medication List    TAKE these medications   aspirin EC 81 MG tablet Take 81 mg by mouth daily. Swallow whole.   Comfort Fit Maternity Supp Lg Misc 1 Units by  Does not apply route daily.   Mag-Oxide 200 MG Tabs Generic drug: Magnesium Oxide Take 2 tablets (400 mg total) by mouth at bedtime. If that amount causes loose stools in the am, switch to 200mg  daily at bedtime.   ondansetron 4 MG disintegrating tablet Commonly known as: Zofran ODT Take 1 tablet (4 mg total) by mouth every 8 (eight) hours as needed for nausea or vomiting.   prenatal multivitamin Tabs tablet Take 1 tablet by mouth daily at 12 noon.       -safe meds in pregnancy list given with focus on symptomatic treatment -advised pt be seen in ED/Urgent Care with worsening URI symptoms -return MAU precautions given -pt discharged to home in stable condition  E Sumiya Mamaril 03/04/2020, 4:32 PM

## 2020-03-04 NOTE — Discharge Instructions (Signed)

## 2020-03-04 NOTE — MAU Note (Signed)
Called lab about urine result and lab have misplaced urine NT sent urine after being collected and witnessed by RN  

## 2020-03-06 LAB — CULTURE, GROUP A STREP (THRC)

## 2020-03-11 DIAGNOSIS — Z3A38 38 weeks gestation of pregnancy: Secondary | ICD-10-CM | POA: Diagnosis not present

## 2020-03-11 DIAGNOSIS — O99213 Obesity complicating pregnancy, third trimester: Secondary | ICD-10-CM | POA: Diagnosis not present

## 2020-03-15 ENCOUNTER — Other Ambulatory Visit: Payer: Self-pay | Admitting: Obstetrics and Gynecology

## 2020-03-16 ENCOUNTER — Telehealth (HOSPITAL_COMMUNITY): Payer: Self-pay | Admitting: *Deleted

## 2020-03-16 ENCOUNTER — Encounter (HOSPITAL_COMMUNITY): Payer: Self-pay | Admitting: *Deleted

## 2020-03-16 NOTE — Telephone Encounter (Signed)
Preadmission screen  

## 2020-03-17 ENCOUNTER — Encounter (HOSPITAL_COMMUNITY): Payer: Self-pay | Admitting: Obstetrics & Gynecology

## 2020-03-17 ENCOUNTER — Other Ambulatory Visit: Payer: Self-pay

## 2020-03-17 ENCOUNTER — Inpatient Hospital Stay (HOSPITAL_COMMUNITY)
Admission: AD | Admit: 2020-03-17 | Discharge: 2020-03-20 | DRG: 806 | Disposition: A | Discharge status: Home / Self Care | Payer: Medicaid Other | Attending: Obstetrics & Gynecology | Admitting: Obstetrics & Gynecology

## 2020-03-17 ENCOUNTER — Inpatient Hospital Stay (HOSPITAL_COMMUNITY)
Admission: AD | Admit: 2020-03-17 | Payer: Medicaid Other | Source: Home / Self Care | Admitting: Obstetrics & Gynecology

## 2020-03-17 DIAGNOSIS — O99214 Obesity complicating childbirth: Secondary | ICD-10-CM | POA: Diagnosis present

## 2020-03-17 DIAGNOSIS — Z6791 Unspecified blood type, Rh negative: Secondary | ICD-10-CM

## 2020-03-17 DIAGNOSIS — O9081 Anemia of the puerperium: Secondary | ICD-10-CM | POA: Diagnosis not present

## 2020-03-17 DIAGNOSIS — Z331 Pregnant state, incidental: Secondary | ICD-10-CM | POA: Diagnosis not present

## 2020-03-17 DIAGNOSIS — O169 Unspecified maternal hypertension, unspecified trimester: Secondary | ICD-10-CM | POA: Diagnosis present

## 2020-03-17 DIAGNOSIS — Z8759 Personal history of other complications of pregnancy, childbirth and the puerperium: Secondary | ICD-10-CM

## 2020-03-17 DIAGNOSIS — Z0183 Encounter for blood typing: Secondary | ICD-10-CM | POA: Diagnosis not present

## 2020-03-17 DIAGNOSIS — O99213 Obesity complicating pregnancy, third trimester: Secondary | ICD-10-CM | POA: Diagnosis not present

## 2020-03-17 DIAGNOSIS — O134 Gestational [pregnancy-induced] hypertension without significant proteinuria, complicating childbirth: Principal | ICD-10-CM | POA: Diagnosis present

## 2020-03-17 DIAGNOSIS — Z3A38 38 weeks gestation of pregnancy: Secondary | ICD-10-CM | POA: Diagnosis not present

## 2020-03-17 DIAGNOSIS — D62 Acute posthemorrhagic anemia: Secondary | ICD-10-CM | POA: Diagnosis not present

## 2020-03-17 DIAGNOSIS — O99210 Obesity complicating pregnancy, unspecified trimester: Secondary | ICD-10-CM | POA: Diagnosis present

## 2020-03-17 DIAGNOSIS — Z3A39 39 weeks gestation of pregnancy: Secondary | ICD-10-CM

## 2020-03-17 DIAGNOSIS — Z20822 Contact with and (suspected) exposure to covid-19: Secondary | ICD-10-CM | POA: Diagnosis not present

## 2020-03-17 DIAGNOSIS — O26899 Other specified pregnancy related conditions, unspecified trimester: Secondary | ICD-10-CM

## 2020-03-17 DIAGNOSIS — O99824 Streptococcus B carrier state complicating childbirth: Secondary | ICD-10-CM | POA: Diagnosis present

## 2020-03-17 DIAGNOSIS — O26893 Other specified pregnancy related conditions, third trimester: Secondary | ICD-10-CM | POA: Diagnosis not present

## 2020-03-17 DIAGNOSIS — Z22330 Carrier of Group B streptococcus: Secondary | ICD-10-CM | POA: Diagnosis not present

## 2020-03-17 DIAGNOSIS — O1490 Unspecified pre-eclampsia, unspecified trimester: Secondary | ICD-10-CM | POA: Diagnosis present

## 2020-03-17 DIAGNOSIS — O139 Gestational [pregnancy-induced] hypertension without significant proteinuria, unspecified trimester: Secondary | ICD-10-CM | POA: Diagnosis present

## 2020-03-17 DIAGNOSIS — O99820 Streptococcus B carrier state complicating pregnancy: Secondary | ICD-10-CM

## 2020-03-17 LAB — CBC
HCT: 37.7 % (ref 36.0–46.0)
Hemoglobin: 11.6 g/dL — ABNORMAL LOW (ref 12.0–15.0)
MCH: 23.4 pg — ABNORMAL LOW (ref 26.0–34.0)
MCHC: 30.8 g/dL (ref 30.0–36.0)
MCV: 76.2 fL — ABNORMAL LOW (ref 80.0–100.0)
Platelets: 316 10*3/uL (ref 150–400)
RBC: 4.95 MIL/uL (ref 3.87–5.11)
RDW: 15.2 % (ref 11.5–15.5)
WBC: 10.5 10*3/uL (ref 4.0–10.5)
nRBC: 0 % (ref 0.0–0.2)

## 2020-03-17 LAB — RESP PANEL BY RT-PCR (FLU A&B, COVID) ARPGX2
Influenza A by PCR: NEGATIVE
Influenza B by PCR: NEGATIVE
SARS Coronavirus 2 by RT PCR: NEGATIVE

## 2020-03-17 LAB — TYPE AND SCREEN
ABO/RH(D): O NEG
Antibody Screen: NEGATIVE

## 2020-03-17 MED ORDER — MISOPROSTOL 50MCG HALF TABLET
50.0000 ug | ORAL_TABLET | ORAL | Status: DC | PRN
  Administered 2020-03-18 (×2): 50 ug via ORAL
  Filled 2020-03-17 (×2): qty 1

## 2020-03-17 MED ORDER — SOD CITRATE-CITRIC ACID 500-334 MG/5ML PO SOLN: 30.0000 mL | ORAL | Status: DC | PRN

## 2020-03-17 MED ORDER — SODIUM CHLORIDE 0.9 % IV SOLN
5.0000 10*6.[IU] | Freq: Once | INTRAVENOUS | Status: AC
  Administered 2020-03-18: 5 10*6.[IU] via INTRAVENOUS
  Filled 2020-03-17 (×2): qty 5

## 2020-03-17 MED ORDER — ACETAMINOPHEN 325 MG PO TABS: 650.0000 mg | ORAL_TABLET | ORAL | Status: DC | PRN

## 2020-03-17 MED ORDER — OXYTOCIN BOLUS FROM INFUSION
333.0000 mL | Freq: Once | INTRAVENOUS | Status: AC
  Administered 2020-03-18: 333 mL via INTRAVENOUS

## 2020-03-17 MED ORDER — TERBUTALINE SULFATE 1 MG/ML IJ SOLN: 0.2500 mg | Freq: Once | INTRAMUSCULAR | Status: DC | PRN

## 2020-03-17 MED ORDER — OXYTOCIN-SODIUM CHLORIDE 30-0.9 UT/500ML-% IV SOLN: 2.5000 [IU]/h | INTRAVENOUS | Status: DC

## 2020-03-17 MED ORDER — FLEET ENEMA 7-19 GM/118ML RE ENEM: 1.0000 | ENEMA | RECTAL | Status: DC | PRN

## 2020-03-17 MED ORDER — OXYCODONE-ACETAMINOPHEN 5-325 MG PO TABS: 2.0000 | ORAL_TABLET | ORAL | Status: DC | PRN

## 2020-03-17 MED ORDER — LACTATED RINGERS IV SOLN
500.0000 mL | INTRAVENOUS | Status: DC | PRN
  Administered 2020-03-18: 500 mL via INTRAVENOUS

## 2020-03-17 MED ORDER — ONDANSETRON HCL 4 MG/2ML IJ SOLN: 4.0000 mg | Freq: Four times a day (QID) | INTRAMUSCULAR | Status: DC | PRN

## 2020-03-17 MED ORDER — LIDOCAINE HCL (PF) 1 % IJ SOLN: 30.0000 mL | INTRAMUSCULAR | Status: DC | PRN

## 2020-03-17 MED ORDER — LACTATED RINGERS IV SOLN: INTRAVENOUS | Status: DC

## 2020-03-17 MED ORDER — OXYCODONE-ACETAMINOPHEN 5-325 MG PO TABS: 1.0000 | ORAL_TABLET | ORAL | Status: DC | PRN

## 2020-03-17 MED ORDER — PENICILLIN G POT IN DEXTROSE 60000 UNIT/ML IV SOLN
3.0000 10*6.[IU] | INTRAVENOUS | Status: DC
  Administered 2020-03-18 (×2): 3 10*6.[IU] via INTRAVENOUS
  Filled 2020-03-17 (×4): qty 50

## 2020-03-17 NOTE — MAU Note (Signed)
Patient here for direct admit for gestational hypertension.  Patient denies HA/visual disturbances/epigastric pain.  Denies LOF/VB/CTX.

## 2020-03-17 NOTE — H&P (Addendum)
OB ADMISSION/ HISTORY & PHYSICAL:  Admission Date: 03/17/2020  6:05 PM  Admit Diagnosis: gestational hypertension  Dana Solomon is a 24 y.o. female G45P0101 [redacted]w[redacted]d presenting for elevated BP in office. Endorses active FM, denies LOF and vaginal bleeding. Hx of pre-eclampsia w/ last preg. Transitional housing (Lives @ [facility]).   History of current pregnancy: G2P0101   Patient entered care with [facility] at 13+3 wks.   EDC 03/22/20 by [place] @ 8+5 wks   Antenatal testing: for BMI 43 started at 32 weeks Last evaluation: 39+2 wks 7lbs 5oz, 43%, vtx, AFI 8, ant placenta, BPP 8/8 Significant prenatal events:  Patient Active Problem List   Diagnosis Date Noted  . Pre-eclampsia 03/17/2020  . Rh negative state in antepartum period 03/17/2020  . GBS (group B Streptococcus carrier), +RV culture, currently pregnant 03/17/2020  . History of severe pre-eclampsia 03/17/2020  . Maternal obesity affecting pregnancy, antepartum 03/17/2020    Prenatal Labs: ABO, Rh: --/--/O NEG (11/24 1852) Antibody: NEG (11/24 1852) Rubella:   immune RPR:   NR HBsAg:   NR HIV:   NR GTT: passed GBS:   positive GC/CHL: neg/neg Genetics: insufficient fetal DNA Tdap/influenza vaccines: declined both   OB History  Gravida Para Term Preterm AB Living  2 1   1   1   SAB TAB Ectopic Multiple Live Births          1    # Outcome Date GA Lbr Len/2nd Weight Sex Delivery Anes PTL Lv  2 Current           1 Preterm 2014 [redacted]w[redacted]d    Vag-Spont       Medical / Surgical History: Past medical history:  Past Medical History:  Diagnosis Date  . Gestational diabetes    first pregnancy  . Hypertension   . Medical history non-contributory   . Pregnancy induced hypertension     Past surgical history:  Past Surgical History:  Procedure Laterality Date  . Belly button      approx 10 years ago  . naval     age 52y   Family History:  Family History  Problem Relation Age of Onset  . Hypertension Father     Social  History:  reports that she has never smoked. She has never used smokeless tobacco. She reports previous alcohol use. She reports that she does not use drugs.  Allergies: Patient has no known allergies.   Current Medications at time of admission:  Prior to Admission medications   Medication Sig Start Date End Date Taking? Authorizing Provider  aspirin EC 81 MG tablet Take 81 mg by mouth daily. Swallow whole.   Yes [provider]  Magnesium Oxide (MAG-OXIDE) 200 MG TABS Take 2 tablets (400 mg total) by mouth at bedtime. If that amount causes loose stools in the am, switch to 200mg  daily at bedtime. 01/30/20  Yes R, CNM  ondansetron (ZOFRAN ODT) 4 MG disintegrating tablet Take 1 tablet (4 mg total) by mouth every 8 (eight) hours as needed for nausea or vomiting. 10/20/19  Yes Edd Arbour, CNM  Prenatal Vit-Fe Fumarate-FA (PRENATAL MULTIVITAMIN) TABS tablet Take 1 tablet by mouth daily at 12 noon.   Yes [provider]  Elastic Bandages & Supports (COMFORT FIT MATERNITY SUPP LG) MISC 1 Units by Does not apply route daily. 01/30/20   Donette Larry, CNM    Review of Systems: Constitutional: Negative   HENT: Negative   Eyes: Negative   Respiratory:  Negative   Cardiovascular: Negative   Gastrointestinal: Negative  Genitourinary: neg for bloody show, neg for LOF   Musculoskeletal: Negative   Skin: Negative   Neurological: Negative   Endo/Heme/Allergies: Negative   Psychiatric/Behavioral: Negative    Physical Exam: VS: Blood pressure 121/62, pulse (!) 102, temperature 98 F (36.7 C), temperature source Oral, resp. rate 18, height 5' 4.5" (1.638 m), weight 134.1 kg, last menstrual period 06/23/2019. AAO x3, no signs of distress Cardiovascular: RRR Respiratory: Lung fields clear to ausculation GU/GI: Abdomen gravid, non-tender, non-distended, active FM, vertex, EFW 7.5# per Leopold's Extremities: trace, non-pitting edema, negative for pain,  tenderness, and cords  Cervical exam:Dilation: Fingertip Effacement (%): Thick Station: Ballotable Exam by:: Lizbeth Bark CNM FHR: baseline rate 125 / variability moderate / accelerations present / absent decelerations TOCO: irreg   Prenatal Transfer Tool  Maternal Diabetes: No Genetic Screening: insufficient fetal sample Maternal Ultrasounds/Referrals: Normal Fetal Ultrasounds or other Referrals:  None Maternal Substance Abuse:  No Significant Maternal Medications:  None Significant Maternal Lab Results: Group B Strep positive    Assessment: Dana Solomon  IOL for GHTN Rh neg Transitional housing FHR category 1 GBS positive Pain management plan: desires unmedicated labor and birth   Plan:  Admit to L&D Routine admission orders Epidural PRN PCN for GBS prophylaxis Rhogam PP Lives at [facility]    -SW consult ordered  Dr Richardson Dana Solomon notified of admission and plan of care  Roma Schanz MSN, CNM 03/17/2020 9:42 PM

## 2020-03-18 ENCOUNTER — Encounter (HOSPITAL_COMMUNITY): Payer: Self-pay | Admitting: Obstetrics & Gynecology

## 2020-03-18 ENCOUNTER — Inpatient Hospital Stay (HOSPITAL_COMMUNITY): Payer: Medicaid Other | Admitting: Anesthesiology

## 2020-03-18 DIAGNOSIS — O169 Unspecified maternal hypertension, unspecified trimester: Secondary | ICD-10-CM | POA: Diagnosis present

## 2020-03-18 DIAGNOSIS — Z3A39 39 weeks gestation of pregnancy: Secondary | ICD-10-CM | POA: Diagnosis not present

## 2020-03-18 DIAGNOSIS — O1493 Unspecified pre-eclampsia, third trimester: Secondary | ICD-10-CM | POA: Diagnosis not present

## 2020-03-18 DIAGNOSIS — O164 Unspecified maternal hypertension, complicating childbirth: Secondary | ICD-10-CM | POA: Diagnosis not present

## 2020-03-18 DIAGNOSIS — O2492 Unspecified diabetes mellitus in childbirth: Secondary | ICD-10-CM | POA: Diagnosis not present

## 2020-03-18 LAB — CBC WITH DIFFERENTIAL/PLATELET
Abs Immature Granulocytes: 0.04 10*3/uL (ref 0.00–0.07)
Basophils Absolute: 0 10*3/uL (ref 0.0–0.1)
Basophils Relative: 0 %
Eosinophils Absolute: 0 10*3/uL (ref 0.0–0.5)
Eosinophils Relative: 0 %
HCT: 34 % — ABNORMAL LOW (ref 36.0–46.0)
Hemoglobin: 10.8 g/dL — ABNORMAL LOW (ref 12.0–15.0)
Immature Granulocytes: 0 %
Lymphocytes Relative: 11 %
Lymphs Abs: 1.3 10*3/uL (ref 0.7–4.0)
MCH: 23.4 pg — ABNORMAL LOW (ref 26.0–34.0)
MCHC: 31.8 g/dL (ref 30.0–36.0)
MCV: 73.8 fL — ABNORMAL LOW (ref 80.0–100.0)
Monocytes Absolute: 1.1 10*3/uL — ABNORMAL HIGH (ref 0.1–1.0)
Monocytes Relative: 9 %
Neutro Abs: 9.3 10*3/uL — ABNORMAL HIGH (ref 1.7–7.7)
Neutrophils Relative %: 80 %
Platelets: 292 10*3/uL (ref 150–400)
RBC: 4.61 MIL/uL (ref 3.87–5.11)
RDW: 14.7 % (ref 11.5–15.5)
WBC: 11.8 10*3/uL — ABNORMAL HIGH (ref 4.0–10.5)
nRBC: 0 % (ref 0.0–0.2)

## 2020-03-18 LAB — PROTEIN / CREATININE RATIO, URINE
Creatinine, Urine: 113.21 mg/dL
Protein Creatinine Ratio: 0.18 mg/mg{Cre} — ABNORMAL HIGH (ref 0.00–0.15)
Total Protein, Urine: 20 mg/dL

## 2020-03-18 LAB — RPR: RPR Ser Ql: NONREACTIVE

## 2020-03-18 MED ORDER — DIPHENHYDRAMINE HCL 25 MG PO CAPS: 25.0000 mg | ORAL_CAPSULE | Freq: Four times a day (QID) | ORAL | Status: DC | PRN

## 2020-03-18 MED ORDER — ZOLPIDEM TARTRATE 5 MG PO TABS: 5.0000 mg | ORAL_TABLET | Freq: Every evening | ORAL | Status: DC | PRN

## 2020-03-18 MED ORDER — IBUPROFEN 600 MG PO TABS
600.0000 mg | ORAL_TABLET | Freq: Four times a day (QID) | ORAL | Status: DC
  Administered 2020-03-18 – 2020-03-20 (×6): 600 mg via ORAL
  Filled 2020-03-18 (×7): qty 1

## 2020-03-18 MED ORDER — SIMETHICONE 80 MG PO CHEW: 80.0000 mg | CHEWABLE_TABLET | ORAL | Status: DC | PRN

## 2020-03-18 MED ORDER — DIPHENHYDRAMINE HCL 50 MG/ML IJ SOLN: 12.5000 mg | INTRAMUSCULAR | Status: DC | PRN

## 2020-03-18 MED ORDER — SENNOSIDES-DOCUSATE SODIUM 8.6-50 MG PO TABS
2.0000 | ORAL_TABLET | ORAL | Status: DC
  Administered 2020-03-18: 2 via ORAL
  Filled 2020-03-18 (×2): qty 2

## 2020-03-18 MED ORDER — COCONUT OIL OIL: 1.0000 "application " | TOPICAL_OIL | Status: DC | PRN

## 2020-03-18 MED ORDER — TETANUS-DIPHTH-ACELL PERTUSSIS 5-2.5-18.5 LF-MCG/0.5 IM SUSY: 0.5000 mL | PREFILLED_SYRINGE | Freq: Once | INTRAMUSCULAR | Status: DC

## 2020-03-18 MED ORDER — PHENYLEPHRINE 40 MCG/ML (10ML) SYRINGE FOR IV PUSH (FOR BLOOD PRESSURE SUPPORT): 80.0000 ug | PREFILLED_SYRINGE | INTRAVENOUS | Status: DC | PRN

## 2020-03-18 MED ORDER — FENTANYL-BUPIVACAINE-NACL 0.5-0.125-0.9 MG/250ML-% EP SOLN
EPIDURAL | Status: AC
  Filled 2020-03-18: qty 250

## 2020-03-18 MED ORDER — DIBUCAINE (PERIANAL) 1 % EX OINT: 1.0000 "application " | TOPICAL_OINTMENT | CUTANEOUS | Status: DC | PRN

## 2020-03-18 MED ORDER — FENTANYL CITRATE (PF) 100 MCG/2ML IJ SOLN
INTRAMUSCULAR | Status: AC
  Administered 2020-03-18: 100 ug via INTRAVENOUS
  Filled 2020-03-18: qty 2

## 2020-03-18 MED ORDER — FENTANYL CITRATE (PF) 100 MCG/2ML IJ SOLN
100.0000 ug | INTRAMUSCULAR | Status: DC | PRN
  Administered 2020-03-18: 100 ug via INTRAVENOUS
  Filled 2020-03-18: qty 2

## 2020-03-18 MED ORDER — PRENATAL MULTIVITAMIN CH
1.0000 | ORAL_TABLET | Freq: Every day | ORAL | Status: DC
  Administered 2020-03-19 – 2020-03-20 (×2): 1 via ORAL
  Filled 2020-03-18 (×2): qty 1

## 2020-03-18 MED ORDER — WITCH HAZEL-GLYCERIN EX PADS: 1.0000 "application " | MEDICATED_PAD | CUTANEOUS | Status: DC | PRN

## 2020-03-18 MED ORDER — ACETAMINOPHEN 325 MG PO TABS
650.0000 mg | ORAL_TABLET | ORAL | Status: DC | PRN
  Administered 2020-03-20: 650 mg via ORAL
  Filled 2020-03-18: qty 2

## 2020-03-18 MED ORDER — OXYTOCIN-SODIUM CHLORIDE 30-0.9 UT/500ML-% IV SOLN
1.0000 m[IU]/min | INTRAVENOUS | Status: DC
  Administered 2020-03-18: 1 m[IU]/min via INTRAVENOUS
  Filled 2020-03-18: qty 500

## 2020-03-18 MED ORDER — LACTATED RINGERS IV SOLN: 500.0000 mL | Freq: Once | INTRAVENOUS | Status: DC

## 2020-03-18 MED ORDER — FENTANYL-BUPIVACAINE-NACL 0.5-0.125-0.9 MG/250ML-% EP SOLN: 12.0000 mL/h | EPIDURAL | Status: DC | PRN

## 2020-03-18 MED ORDER — LIDOCAINE HCL (PF) 1 % IJ SOLN
INTRAMUSCULAR | Status: DC | PRN
  Administered 2020-03-18: 5 mL via EPIDURAL

## 2020-03-18 MED ORDER — SODIUM CHLORIDE (PF) 0.9 % IJ SOLN
INTRAMUSCULAR | Status: DC | PRN
  Administered 2020-03-18: 12 mL/h via EPIDURAL

## 2020-03-18 MED ORDER — EPHEDRINE 5 MG/ML INJ: 10.0000 mg | INTRAVENOUS | Status: DC | PRN

## 2020-03-18 MED ORDER — ONDANSETRON HCL 4 MG/2ML IJ SOLN: 4.0000 mg | INTRAMUSCULAR | Status: DC | PRN

## 2020-03-18 MED ORDER — OXYTOCIN-SODIUM CHLORIDE 30-0.9 UT/500ML-% IV SOLN: 1.0000 m[IU]/min | INTRAVENOUS | Status: DC

## 2020-03-18 MED ORDER — ONDANSETRON HCL 4 MG PO TABS: 4.0000 mg | ORAL_TABLET | ORAL | Status: DC | PRN

## 2020-03-18 MED ORDER — BENZOCAINE-MENTHOL 20-0.5 % EX AERO: 1.0000 "application " | INHALATION_SPRAY | CUTANEOUS | Status: DC | PRN

## 2020-03-18 MED ORDER — TERBUTALINE SULFATE 1 MG/ML IJ SOLN: 0.2500 mg | Freq: Once | INTRAMUSCULAR | Status: DC | PRN

## 2020-03-18 NOTE — Progress Notes (Signed)
Dana Solomon is a 24 y.o. G2P0101 at [redacted]w[redacted]d by admitted for elevated blood pressure in the office.   Subjective: Patient has had 2 doses of buccal Cytotec overnight and has been sleeping through her contractions. Discussed the R/B/A of Foley balloon for cervical ripening and patient consents to procedure.   Objective: Vitals:   03/18/20 0639 03/18/20 0724 03/18/20 0807 03/18/20 0914  BP: 129/76 131/64 (!) 105/56 130/61  Pulse: 90 91 91 85  Resp:      Temp: 98 F (36.7 C)     TempSrc: Oral     Weight:      Height:        FHT:  FHR: 120 bpm, variability: moderate,  accelerations:  Present,  decelerations:  Absent UC:   occasional SVE:   Dilation: 1 Effacement (%): 50 Station: Ballotable Exam by:: Dorisann Frames, CNM   Foley balloon placed without difficulty and 60cc of fluid instilled in balloon. Taped to leg for traction.   Assessment / Plan: G58P0101 24 y.o. [redacted]w[redacted]d Induction of labor due to gestational hypertension, s/p 2 doses of buccal Cytotec  Labor: Cervical ripening in progress Preeclampsia:  no signs or symptoms of toxicity and labs stable Fetal Wellbeing:  Category I Pain Control:  Labor support without medications I/D:  GBS positive, treated adequately Anticipated MOD:  NSVD   Will start Pitocin 1x1 until adequate contractions. Consider AROM after Foley balloon is expelled.  [name], CNM, MSN 03/18/2020, 9:47 AM

## 2020-03-18 NOTE — Anesthesia Preprocedure Evaluation (Addendum)
Anesthesia Evaluation  Patient identified by MRN, date of birth, ID band Patient awake    Reviewed: Allergy & Precautions, NPO status , Patient's Chart, lab work & pertinent test results  Airway Mallampati: III  TM Distance: >3 FB Neck ROM: Full    Dental no notable dental hx. (+) Teeth Intact, Dental Advisory Given   Pulmonary neg pulmonary ROS,    Pulmonary exam normal breath sounds clear to auscultation       Cardiovascular hypertension, Pt. on medications Normal cardiovascular exam Rhythm:Regular Rate:Normal  On Mg++   Neuro/Psych negative neurological ROS  negative psych ROS   GI/Hepatic negative GI ROS, Neg liver ROS,   Endo/Other  diabetesMorbid obesity  Renal/GU      Musculoskeletal   Abdominal   Peds  Hematology Hgb 10.8 Plt 292   Anesthesia Other Findings   Reproductive/Obstetrics (+) Pregnancy                            Anesthesia Physical Anesthesia Plan  ASA: III  Anesthesia Plan: Epidural   Post-op Pain Management:    Induction:   PONV Risk Score and Plan:   Airway Management Planned:   Additional Equipment:   Intra-op Plan:   Post-operative Plan:   Informed Consent: I have reviewed the patients History and Physical, chart, labs and discussed the procedure including the risks, benefits and alternatives for the proposed anesthesia with the patient or authorized representative who has indicated his/her understanding and acceptance.       Plan Discussed with:   Anesthesia Plan Comments: (39.3 Wk G2P1 with Pre E on Mg, BMI (50) for LEA)        Anesthesia Quick Evaluation

## 2020-03-18 NOTE — Anesthesia Procedure Notes (Signed)
Epidural Patient location during procedure: OB Start time: 03/18/2020 8:09 PM End time: 03/18/2020 8:23 PM  Staffing Anesthesiologist: Trevor Iha, MD Performed: anesthesiologist   Preanesthetic Checklist Completed: patient identified, IV checked, site marked, risks and benefits discussed, surgical consent, monitors and equipment checked, pre-op evaluation and timeout performed  Epidural Patient position: sitting Prep: DuraPrep and site prepped and draped Patient monitoring: continuous pulse ox and blood pressure Approach: midline Location: L3-L4 Injection technique: LOR air  Needle:  Needle type: Tuohy  Needle gauge: 17 G Needle length: 9 cm and 9 Needle insertion depth: 9 cm Catheter type: closed end flexible Catheter size: 19 Gauge Catheter at skin depth: 15 cm Test dose: negative  Assessment Events: blood not aspirated, injection not painful, no injection resistance, no paresthesia and negative IV test  Additional Notes Patient identified. Risks/Benefits/Options discussed with patient including but not limited to bleeding, infection, nerve damage, paralysis, failed block, incomplete pain control, headache, blood pressure changes, nausea, vomiting, reactions to medication both or allergic, itching and postpartum back pain. Confirmed with bedside nurse the patient's most recent platelet count. Confirmed with patient that they are not currently taking any anticoagulation, have any bleeding history or any family history of bleeding disorders. Patient expressed understanding and wished to proceed. All questions were answered. Sterile technique was used throughout the entire procedure. Please see nursing notes for vital signs. Test dose was given through epidural needle and negative prior to continuing to dose epidural or start infusion. Warning signs of high block given to the patient including shortness of breath, tingling/numbness in hands, complete motor block, or any  concerning symptoms with instructions to call for help. Patient was given instructions on fall risk and not to get out of bed. All questions and concerns addressed with instructions to call with any issues.1 Attempt (S) . Patient tolerated procedure well.

## 2020-03-18 NOTE — Progress Notes (Signed)
Dana Solomon is a 24 y.o. G2P0101 at [redacted]w[redacted]d by admitted for elevated blood pressure in the office.   Subjective: Patient feeling contraction pain and is up and moving around with each contraction. RN notified CNM that FHR tracing is inconsistent and it is difficult to assess contractions due to maternal habitus and movement. RN requests internal monitoring in order to titrate Pitocin. Discussed the R/B/A of IUPC and FSE placement for monitoring and patient consents to procedure.   Objective: Vitals:   03/18/20 1439 03/18/20 1459 03/18/20 1500 03/18/20 1530  BP:  117/89 117/89 125/83  Pulse:  98 98 89  Resp: 17     Temp: 98 F (36.7 C)     TempSrc: Oral     Weight:      Height:        FHT:  FHR: 130 bpm, variability: moderate,  accelerations:  Present,  decelerations:  Present occasional early decelerations; variable decelerations to the 100s for <1 minute, positive scalp stim UC:   q 3 minutes SVE:   Dilation: 5 Effacement (%): 70 Station: -2 Exam by:: AYetta Barre, CNM   IUPC and FSE placed without difficulty  Assessment / Plan: G80P0101 24 y.o. [redacted]w[redacted]d Induction of labor due to gestational hypertension, s/p 2 doses of buccal Cytotec and Foley balloon, Pitocin infusing @ 10mu  Labor: Progressing normally and working towards active labor Preeclampsia:  no signs or symptoms of toxicity and labs stable Fetal Wellbeing:  Category I Pain Control:  Labor support without medications I/D:  GBS positive, treated adequately Anticipated MOD:  NSVD   Continue to increase Pitocin 2x2 until adequate MVUs. Patient may have IV pain medication or epidural upon request.   Clancy Gourd, MSN 03/18/2020, 4:57 PM

## 2020-03-18 NOTE — Progress Notes (Signed)
CSW acknowledged consult for patient resides at [facility] and utilizes public transportation. CSW met with patient at bedside and introduced self. Patient reported that she was interested in meeting with CSW. Patient shared that she was staying at [facility] and they assisted her with getting into an apartment. Patient reported that she moved into her apartment yesterday and her new address is [address] [place], [place] [address]. Patient reported that she has supports and all items needed to care for infant. CSW inquired about any needs/concerns. Patient reported that she needed assistance with transportation. CSW informed patient about medicaid transportation. Patient reported that she is already set up. CSW provided patient with the contact information for [facility] Medicaid to arrange needed transportation. Patient reported that she also needed to sign up for [facility]. CSW provided patient with contact information for [facility] office. Patient denied any other needs/concerns. CSW provided patient with CSW contact information and encouraged patient to contact CSW if any needs/concerns arise.   Abundio Miu, [place] Worker [facility] Cell#: [phone]

## 2020-03-18 NOTE — Progress Notes (Signed)
Dana Solomon is a 24 y.o. G2P0101 at [redacted]w[redacted]d by admitted for elevated blood pressure in the office.   Subjective: Patient is sleeping through contractions. States she slept through her last labor and felt no pain. Discussed the R/B/A of AROM for labor induction and patient consents to procedure.   Objective: Vitals:   03/18/20 1330 03/18/20 1400 03/18/20 1430 03/18/20 1439  BP: 126/72 118/78 (!) 109/97   Pulse: 86 99 93   Resp:    17  Temp:    98 F (36.7 C)  TempSrc:    Oral  Weight:      Height:        FHT:  FHR: 125 bpm, variability: moderate,  accelerations:  Present,  decelerations:  Present occasional variable decelerations, positive scalp stim UC:   occasional SVE:   Dilation: 5 Effacement (%): 70 Station: -2 Exam by:: AYetta Barre, CNM   AROM at 1438 with moderate meconium  Assessment / Plan: G65P0101 24 y.o. [redacted]w[redacted]d Induction of labor due to gestational hypertension, s/p 2 doses of buccal Cytotec, Foley balloon expelled, Pitocin infusing @ 30mu  Labor: Progressing normally and working towards active labor Preeclampsia:  no signs or symptoms of toxicity and labs stable Fetal Wellbeing:  Category I Pain Control:  Labor support without medications I/D:  GBS positive, treated adequately Anticipated MOD:  NSVD   Continue to increase Pitocin 2x2 until adequate contractions.   [name], CNM, MSN 03/18/2020, 2:43 PM

## 2020-03-19 DIAGNOSIS — D62 Acute posthemorrhagic anemia: Secondary | ICD-10-CM

## 2020-03-19 DIAGNOSIS — O139 Gestational [pregnancy-induced] hypertension without significant proteinuria, unspecified trimester: Secondary | ICD-10-CM

## 2020-03-19 HISTORY — DX: Gestational (pregnancy-induced) hypertension without significant proteinuria, unspecified trimester: O13.9

## 2020-03-19 HISTORY — DX: Acute posthemorrhagic anemia: D62

## 2020-03-19 LAB — CBC
HCT: 31.7 % — ABNORMAL LOW (ref 36.0–46.0)
Hemoglobin: 9.9 g/dL — ABNORMAL LOW (ref 12.0–15.0)
MCH: 23.5 pg — ABNORMAL LOW (ref 26.0–34.0)
MCHC: 31.2 g/dL (ref 30.0–36.0)
MCV: 75.1 fL — ABNORMAL LOW (ref 80.0–100.0)
Platelets: 284 10*3/uL (ref 150–400)
RBC: 4.22 MIL/uL (ref 3.87–5.11)
RDW: 14.9 % (ref 11.5–15.5)
WBC: 14.1 10*3/uL — ABNORMAL HIGH (ref 4.0–10.5)
nRBC: 0 % (ref 0.0–0.2)

## 2020-03-19 MED ORDER — MAGNESIUM OXIDE 400 (241.3 MG) MG PO TABS
400.0000 mg | ORAL_TABLET | Freq: Every day | ORAL | Status: DC
  Administered 2020-03-19 – 2020-03-20 (×2): 400 mg via ORAL
  Filled 2020-03-19 (×2): qty 1

## 2020-03-19 MED ORDER — POLYSACCHARIDE IRON COMPLEX 150 MG PO CAPS
150.0000 mg | ORAL_CAPSULE | Freq: Every day | ORAL | Status: DC
  Administered 2020-03-19 – 2020-03-20 (×2): 150 mg via ORAL
  Filled 2020-03-19 (×2): qty 1

## 2020-03-19 NOTE — Progress Notes (Signed)
Subjective: Postpartum Day # 1 : S/P NSVD (Progressed with Cytotex, IP foley, AROM and pitocin)  on 11/25 due to IOL on 11/24 for GHTN, no meds, PCR 0.18, H/O PreE, currently BP 106/68, asymptomatic, denies HA, RUQ pain or vision changes. RH-, baby RH neg. BMI 49. Pt living in the hotel and has seen SW. Patient up ad lib, denies syncope or dizziness. Reports consuming regular diet without issues and denies N/V. Patient reports 0 bowel movement + passing flatus.  Denies issues with urination and reports bleeding is "lighter."  Patient is bottle feeding and reports going well.  Desires nexplanon for postpartum contraception.  Pain is being appropriately managed with use of po meds.   NO laceration Feeding:  Bottle Contraceptive plan: Nexpalnon Baby female.   Objective: Vital signs in last 24 hours: Patient Vitals for the past 24 hrs:  BP Temp Temp src Pulse Resp SpO2  03/19/20 1230 112/63 -- -- 98 20 --  03/19/20 0830 (!) 128/55 98.9 F (37.2 C) Oral 100 20 --  03/19/20 0430 106/68 98.3 F (36.8 C) Oral 89 17 100 %  03/19/20 0000 (!) 108/56 98.7 F (37.1 C) Oral 87 16 100 %  03/18/20 2252 116/80 98.7 F (37.1 C) Oral (!) 107 18 100 %  03/18/20 2215 140/60 -- -- (!) 105 20 --  03/18/20 2200 137/61 -- -- (!) 108 18 --  03/18/20 2145 127/64 -- -- (!) 114 18 --  03/18/20 2130 (!) 124/48 -- -- (!) 121 20 --  03/18/20 2115 (!) 127/56 -- -- (!) 119 18 --  03/18/20 2040 (!) 145/66 -- -- (!) 157 20 --  03/18/20 2035 (!) 121/97 -- -- (!) 105 20 99 %  03/18/20 2030 (!) 151/58 -- -- (!) 116 20 99 %  03/18/20 2020 106/87 -- -- (!) 137 20 99 %  03/18/20 2016 -- -- -- -- -- 100 %  03/18/20 1705 (!) 158/88 -- -- (!) 105 -- --  03/18/20 1530 125/83 -- -- 89 -- --  03/18/20 1500 117/89 -- -- 98 -- --  03/18/20 1459 117/89 -- -- 98 -- --  03/18/20 1439 -- 98 F (36.7 C) Oral -- 17 --  03/18/20 1430 (!) 109/97 -- -- 93 -- --     Physical Exam:  General: alert, cooperative, appears stated age and  no distress Mood/Affect: Happy Lungs: clear to auscultation, no wheezes, rales or rhonchi, symmetric air entry.  Heart: normal rate, regular rhythm, normal S1, S2, no murmurs, rubs, clicks or gallops. Breast: breasts appear normal, no suspicious masses, no skin or nipple changes or axillary nodes. Abdomen:  + bowel sounds, soft, non-tender GU: perineum intact, healing well. No signs of external hematomas.  Uterine Fundus: firm Lochia: appropriate Skin: Warm, Dry. DVT Evaluation: No evidence of DVT seen on physical exam. Negative Homan's sign. No cords or calf tenderness. No significant calf/ankle edema.  CBC Latest Ref Rng & Units 03/19/2020 03/18/2020 03/17/2020  WBC 4.0 - 10.5 K/uL 14.1(H) 11.8(H) 10.5  Hemoglobin 12.0 - 15.0 g/dL 2.7(O) 10.8(L) 11.6(L)  Hematocrit 36 - 46 % 31.7(L) 34.0(L) 37.7  Platelets 150 - 400 K/uL 284 292 316    Results for orders placed or performed during the hospital encounter of 03/17/20 (from the past 24 hour(s))  CBC with Differential/Platelet     Status: Abnormal   Collection Time: 03/18/20  7:07 PM  Result Value Ref Range   WBC 11.8 (H) 4.0 - 10.5 K/uL   RBC 4.61 3.87 - 5.11  MIL/uL   Hemoglobin 10.8 (L) 12.0 - 15.0 g/dL   HCT 96.0 (L) 36 - 46 %   MCV 73.8 (L) 80.0 - 100.0 fL   MCH 23.4 (L) 26.0 - 34.0 pg   MCHC 31.8 30.0 - 36.0 g/dL   RDW 45.4 09.8 - 11.9 %   Platelets 292 150 - 400 K/uL   nRBC 0.0 0.0 - 0.2 %   Neutrophils Relative % 80 %   Neutro Abs 9.3 (H) 1.7 - 7.7 K/uL   Lymphocytes Relative 11 %   Lymphs Abs 1.3 0.7 - 4.0 K/uL   Monocytes Relative 9 %   Monocytes Absolute 1.1 (H) 0.1 - 1.0 K/uL   Eosinophils Relative 0 %   Eosinophils Absolute 0.0 0.0 - 0.5 K/uL   Basophils Relative 0 %   Basophils Absolute 0.0 0.0 - 0.1 K/uL   Immature Granulocytes 0 %   Abs Immature Granulocytes 0.04 0.00 - 0.07 K/uL  CBC     Status: Abnormal   Collection Time: 03/19/20  5:39 AM  Result Value Ref Range   WBC 14.1 (H) 4.0 - 10.5 K/uL   RBC  4.22 3.87 - 5.11 MIL/uL   Hemoglobin 9.9 (L) 12.0 - 15.0 g/dL   HCT 14.7 (L) 36 - 46 %   MCV 75.1 (L) 80.0 - 100.0 fL   MCH 23.5 (L) 26.0 - 34.0 pg   MCHC 31.2 30.0 - 36.0 g/dL   RDW 82.9 56.2 - 13.0 %   Platelets 284 150 - 400 K/uL   nRBC 0.0 0.0 - 0.2 %     CBG (last 3)  No results for input(s): GLUCAP in the last 72 hours.   I/O last 3 completed shifts: In: -  Out: 50 [Blood:50]   Assessment Postpartum Day # 1 : S/P NSVD (Progressed with Cytotex, IP foley, AROM and pitocin)  on 11/25 due to IOL on 11/24 for GHTN, no meds, PCR 0.18, H/O PreE, currently BP 106/68, asymptomatic, denies HA, RUQ pain or vision changes. RH-, baby RH-. BMI 49. Pt living in the hotel and has seen SW Pt stable. -1 involution. bottlefeeding. Hemodynamically stable with EBL of and hgb drop of 10.8-9.9, asymptomatic on iron.   Plan: Continue other mgmt as ordered VTE prophylactics: Early ambulated as tolerates.  Pain control: Motrin/Tylenol PRN Education given regarding options for contraception, including barrier methods, injectable contraception, IUD placement, oral contraceptives.  Plan for discharge tomorrow, Social Work consult and Contraception Nexplanon   Dr. Mora Appl to be updated on patient status  [facility] NP-C, CNM 03/19/2020, 2:01 PM

## 2020-03-19 NOTE — Progress Notes (Signed)
CSW acknowledges consult for MOB living at [facility]. CSW noted that previous CSW (Kimberly) spoke with MOB on 03/18/20 at 10:33am (please see note in MOB's chart) regarding further needs. CSW aware that MOB reported no other needs or concerns.   Please reconsult CSW if new need arises.     Raiza Kiesel S. Keygan Dumond, MSW, LCSW [facility] [place] [phone]   

## 2020-03-19 NOTE — Anesthesia Postprocedure Evaluation (Signed)
Anesthesia Post Note  Patient: Dana Solomon  Procedure(s) Performed: AN AD HOC LABOR EPIDURAL     Patient location during evaluation: Mother Baby Anesthesia Type: Epidural Level of consciousness: awake and alert Pain management: pain level controlled Vital Signs Assessment: post-procedure vital signs reviewed and stable Respiratory status: spontaneous breathing, nonlabored ventilation and respiratory function stable Cardiovascular status: stable Postop Assessment: no headache, no backache and epidural receding Anesthetic complications: no   No complications documented.  Last Vitals:  Vitals:   03/19/20 0000 03/19/20 0430  BP: (!) 108/56 106/68  Pulse: 87 89  Resp: 16 17  Temp: 37.1 C 36.8 C  SpO2: 100% 100%    Last Pain:  Vitals:   03/19/20 0510  TempSrc:   PainSc: 0-No pain   Pain Goal: Patients Stated Pain Goal: 9 (03/18/20 1800)                 [name]

## 2020-03-20 MED ORDER — IBUPROFEN 600 MG PO TABS
600.0000 mg | ORAL_TABLET | Freq: Four times a day (QID) | ORAL | 0 refills | Status: DC
Start: 2020-03-20 — End: 2021-02-07

## 2020-03-20 MED ORDER — POLYSACCHARIDE IRON COMPLEX 150 MG PO CAPS
150.0000 mg | ORAL_CAPSULE | Freq: Every day | ORAL | 1 refills | Status: DC
Start: 2020-03-21 — End: 2021-10-02

## 2020-03-20 NOTE — Discharge Summary (Signed)
SVD OB Discharge Summary     Patient Name: Dana Solomon DOB: 1995-06-18 MRN: [id number]  Date of admission: 03/17/2020 Delivering MD: Dana Solomon  Date of delivery: 03/18/2020 Type of delivery: SVD  Newborn Data: Sex: Baby female Circumcision: Done with Dr Dana Solomon on 03/19/2020 Live born female  Birth Weight: 6 lb 5.8 oz (2886 g) APGAR: 8, 9  Newborn Delivery   Birth date/time: 03/18/2020 20:51:00 Delivery type: Vaginal, Spontaneous      Feeding: Breast Infant being discharge to home with mother in stable condition.   Admitting diagnosis: Pre-eclampsia [O14.90] Intrauterine pregnancy: [redacted]w[redacted]d     Secondary diagnosis:  Principal Problem:   Postpartum care following vaginal delivery 11/25 Active Problems:   History of severe pre-eclampsia   Maternal obesity affecting pregnancy, antepartum   SVD (spontaneous vaginal delivery) 11/25   Gestational hypertension   Acute blood loss anemia                                Complications: None                                                              Intrapartum Procedures: spontaneous vaginal delivery and GBS prophylaxis Postpartum Procedures: none Complications-Operative and Postpartum: none Augmentation: AROM, Pitocin, Cytotec and IP Foley   History of Present Illness: [name] is a 24 y.o. female, P3A2505, who presents at [redacted]w[redacted]d weeks gestation. The patient has been followed at  [facility]  Her pregnancy has been complicated by:  Patient Active Problem List   Diagnosis Date Noted  . Gestational hypertension 03/19/2020  . Acute blood loss anemia 03/19/2020  . SVD (spontaneous vaginal delivery) 11/25 03/18/2020  . Postpartum care following vaginal delivery 11/25 03/18/2020  . History of severe pre-eclampsia 03/17/2020  . Maternal obesity affecting pregnancy, antepartum 03/17/2020    Hospital course:  Induction of Labor With Vaginal Delivery   24 y.o. yo [id number] at [redacted]w[redacted]d  was admitted to the hospital 03/17/2020 for induction of labor.  Indication for induction: Gestational hypertension.  Patient had an uncomplicated labor course as follows: Membrane Rupture Time/Date: 2:38 PM ,03/18/2020   Delivery Method:Vaginal, Spontaneous  Episiotomy: None  Lacerations:    Details of delivery can be found in separate delivery note.  Patient had a routine postpartum course. Patient is discharged home 03/20/20.  Newborn Data: Birth date:03/18/2020  Birth time:8:51 PM  Gender:Female  Living status:Living  Apgars:8 ,9  [id number] g  Postpartum Day # 2 : S/P NSVD due to presented for IOL on 11/24, progressed with Cytotec, IP foley, AROM and pitocin and had SVD on 11/25 over intact perineum with QBL of , and hgb drop from 11.6-9.9 on po iron daily denies s/sx of anemia. Pt had some housing issues and has been SW, denies any further needs , pt recently moved in her own apartment , pt was teared eyed and feeling overwhlemed, she expressed feeling like there is no much to do and her family (mother and sister) lives in [place] and that feels like "a long way away". She expressing her sister will come to live with her soon, which will help and her mother will visit. She denies the FOB being involved. Pt  denies h/o depression, anxiety, SI, denies ever taking medication. Pt endorses using meditation and baths to help calmn and center her. I explain all about baby blues versus PPD, pt verbalized understanding and would report mood as needed, recommended 1 week mood check. Pt denies having transportation but endorsse saying medicaid can help her. Pt denies SI/HI, and declines starting SSRI. Pt also has GHTN, BP stable 120/66, no meds, pCR was 0.18, denies HA, RUQ pain or vision changes, will f/u in one week for BP check with [facility].  Patient up ad lib, denies syncope or dizziness. Reports consuming regular diet without issues and denies N/V. Patient reports 0 bowel movement + passing flatus.   Denies issues with urination and reports bleeding is "lighter."  Patient is bottlefeeding and reports going well.  Desires undecided now did desire nexplanon ut changed her mind for postpartum contraception.  Pain is being appropriately managed with use of po meds.  Physical exam  Vitals:   03/19/20 1230 03/19/20 1400 03/19/20 2115 03/20/20 0525  BP: 112/63 98/86 130/86 120/66  Pulse: 98 90 94 84  Resp: 20 18 18 18   Temp:  98.2 F (36.8 C) 98.2 F (36.8 C) 98 F (36.7 C)  TempSrc:  Oral Oral Oral  SpO2:  100%    Weight:      Height:       General: alert, cooperative and no distress Lochia: appropriate Uterine Fundus: firm Perineum: Intact DVT Evaluation: No evidence of DVT seen on physical exam. Negative Homan's sign. No cords or calf tenderness. No significant calf/ankle edema.  Labs: Lab Results  Component Value Date   WBC 14.1 (H) 03/19/2020   HGB 9.9 (L) 03/19/2020   HCT 31.7 (L) 03/19/2020   MCV 75.1 (L) 03/19/2020   PLT 284 03/19/2020   CMP Latest Ref Rng & Units 03/04/2020  Glucose 70 - 99 mg/dL 84  BUN 6 - 20 mg/dL 13/02/2020)  Creatinine <2(N - 1.00 mg/dL 0.53  Sodium 9.76 - 734 mmol/L 136  Potassium 3.5 - 5.1 mmol/L 3.6  Chloride 98 - 111 mmol/L 105  CO2 22 - 32 mmol/L 21(L)  Calcium 8.9 - 10.3 mg/dL 9.2  Total Protein 6.5 - 8.1 g/dL 6.5  Total Bilirubin 0.3 - 1.2 mg/dL 0.5  Alkaline Phos 38 - 126 U/L 79  AST 15 - 41 U/L 16  ALT 0 - 44 U/L 18    Date of discharge: 03/20/2020 Discharge Diagnoses: Term Pregnancy-delivered and [facility] Discharge instruction: per After Visit Summary and "Baby and Me Booklet".  After visit meds:   Activity:           unrestricted and pelvic rest Advance as tolerated. Pelvic rest for 6 weeks.  Diet:                routine Medications: PNV, Ibuprofen, Colace and Iron Postpartum contraception: Undecided Condition:  Pt discharge to home with baby in stable and  condition  Anemia: PO Iron GHTN: Monitor Bp >150/100 report, report  s/sx, f/u in one week for BP check Teary-eyed and social support: Monitor Mood report SI/HI, mood check one week.   Meds: Allergies as of 03/20/2020   No Known Allergies     Medication List    STOP taking these medications   aspirin EC 81 MG tablet   Comfort Fit Maternity Supp Lg Misc     TAKE these medications   ibuprofen 600 MG tablet Commonly known as: ADVIL Take 1 tablet (600 mg total) by mouth every 6 (  six) hours.   iron polysaccharides 150 MG capsule Commonly known as: NIFEREX Take 1 capsule (150 mg total) by mouth daily. Start taking on: March 21, 2020   Mag-Oxide 200 MG Tabs Generic drug: Magnesium Oxide Take 2 tablets (400 mg total) by mouth at bedtime. If that amount causes loose stools in the am, switch to 200mg  daily at bedtime.   ondansetron 4 MG disintegrating tablet Commonly known as: Zofran ODT Take 1 tablet (4 mg total) by mouth every 8 (eight) hours as needed for nausea or vomiting.   prenatal multivitamin Tabs tablet Take 1 tablet by mouth daily at 12 noon.       Discharge Follow Up:   Follow-up Information    [facility]. Schedule an appointment as soon as possible for a visit in 1 week(s).   Specialty: Obstetrics and Gynecology Why: 1 week mood and BP check and 6 weeks PPV Contact information: [address] [place] [phone]               [place], NP-C, CNM 03/20/2020, 11:03 AM  03/22/2020, FNP

## 2020-03-22 ENCOUNTER — Telehealth: Payer: Self-pay

## 2020-03-22 NOTE — Telephone Encounter (Signed)
Transition Care Management Unsuccessful Follow-up Telephone Call  Date of discharge and from where:  03/20/2020 [facility]  Attempts:  1st Attempt  Reason for unsuccessful TCM follow-up call:  Left voice message

## 2020-03-23 ENCOUNTER — Other Ambulatory Visit (HOSPITAL_COMMUNITY): Payer: Medicaid Other

## 2020-03-23 NOTE — Telephone Encounter (Signed)
Transition Care Management Unsuccessful Follow-up Telephone Call  Date of discharge and from where:  03/20/2020 [facility]  Attempts:  2nd Attempt  Reason for unsuccessful TCM follow-up call:  Left voice message

## 2020-03-24 DIAGNOSIS — Z419 Encounter for procedure for purposes other than remedying health state, unspecified: Secondary | ICD-10-CM | POA: Diagnosis not present

## 2020-03-24 NOTE — Telephone Encounter (Signed)
Transition Care Management Unsuccessful Follow-up Telephone Call  Date of discharge and from where:  11/27/2021[facility]   Attempts:  3rd Attempt  Reason for unsuccessful TCM follow-up call:  Left voice message

## 2020-03-25 ENCOUNTER — Inpatient Hospital Stay (HOSPITAL_COMMUNITY): Payer: Medicaid Other

## 2020-03-25 ENCOUNTER — Inpatient Hospital Stay (HOSPITAL_COMMUNITY)
Admission: AD | Admit: 2020-03-25 | Payer: Medicaid Other | Source: Home / Self Care | Admitting: Obstetrics and Gynecology

## 2020-03-31 DIAGNOSIS — Z591 Inadequate housing: Secondary | ICD-10-CM | POA: Diagnosis not present

## 2020-03-31 DIAGNOSIS — Z7689 Persons encountering health services in other specified circumstances: Secondary | ICD-10-CM | POA: Diagnosis not present

## 2020-03-31 DIAGNOSIS — R519 Headache, unspecified: Secondary | ICD-10-CM | POA: Diagnosis not present

## 2020-03-31 DIAGNOSIS — O139 Gestational [pregnancy-induced] hypertension without significant proteinuria, unspecified trimester: Secondary | ICD-10-CM | POA: Diagnosis not present

## 2020-04-05 DIAGNOSIS — O165 Unspecified maternal hypertension, complicating the puerperium: Secondary | ICD-10-CM | POA: Diagnosis not present

## 2020-04-12 DIAGNOSIS — Z0130 Encounter for examination of blood pressure without abnormal findings: Secondary | ICD-10-CM | POA: Diagnosis not present

## 2020-04-24 DIAGNOSIS — Z419 Encounter for procedure for purposes other than remedying health state, unspecified: Secondary | ICD-10-CM | POA: Diagnosis not present

## 2020-05-03 DIAGNOSIS — B373 Candidiasis of vulva and vagina: Secondary | ICD-10-CM | POA: Diagnosis not present

## 2020-05-03 DIAGNOSIS — O139 Gestational [pregnancy-induced] hypertension without significant proteinuria, unspecified trimester: Secondary | ICD-10-CM | POA: Diagnosis not present

## 2020-05-03 DIAGNOSIS — Z3009 Encounter for other general counseling and advice on contraception: Secondary | ICD-10-CM | POA: Diagnosis not present

## 2020-05-11 DIAGNOSIS — N898 Other specified noninflammatory disorders of vagina: Secondary | ICD-10-CM | POA: Diagnosis not present

## 2020-05-15 ENCOUNTER — Encounter (HOSPITAL_COMMUNITY): Payer: Self-pay

## 2020-05-15 ENCOUNTER — Other Ambulatory Visit: Payer: Self-pay

## 2020-05-15 ENCOUNTER — Emergency Department (HOSPITAL_COMMUNITY)
Admission: EM | Admit: 2020-05-15 | Discharge: 2020-05-15 | Disposition: A | Discharge status: Home / Self Care | Payer: Medicaid Other | Attending: Emergency Medicine | Admitting: Emergency Medicine

## 2020-05-15 DIAGNOSIS — J111 Influenza due to unidentified influenza virus with other respiratory manifestations: Secondary | ICD-10-CM | POA: Insufficient documentation

## 2020-05-15 DIAGNOSIS — R519 Headache, unspecified: Secondary | ICD-10-CM | POA: Diagnosis not present

## 2020-05-15 DIAGNOSIS — R112 Nausea with vomiting, unspecified: Secondary | ICD-10-CM | POA: Insufficient documentation

## 2020-05-15 DIAGNOSIS — J029 Acute pharyngitis, unspecified: Secondary | ICD-10-CM | POA: Diagnosis present

## 2020-05-15 DIAGNOSIS — I10 Essential (primary) hypertension: Secondary | ICD-10-CM | POA: Insufficient documentation

## 2020-05-15 DIAGNOSIS — M791 Myalgia, unspecified site: Secondary | ICD-10-CM | POA: Insufficient documentation

## 2020-05-15 DIAGNOSIS — R6883 Chills (without fever): Secondary | ICD-10-CM | POA: Diagnosis not present

## 2020-05-15 DIAGNOSIS — M7918 Myalgia, other site: Secondary | ICD-10-CM | POA: Diagnosis not present

## 2020-05-15 DIAGNOSIS — Z79899 Other long term (current) drug therapy: Secondary | ICD-10-CM | POA: Insufficient documentation

## 2020-05-15 DIAGNOSIS — R059 Cough, unspecified: Secondary | ICD-10-CM | POA: Diagnosis not present

## 2020-05-15 DIAGNOSIS — Z20822 Contact with and (suspected) exposure to covid-19: Secondary | ICD-10-CM | POA: Insufficient documentation

## 2020-05-15 DIAGNOSIS — O139 Gestational [pregnancy-induced] hypertension without significant proteinuria, unspecified trimester: Secondary | ICD-10-CM | POA: Diagnosis not present

## 2020-05-15 DIAGNOSIS — O24419 Gestational diabetes mellitus in pregnancy, unspecified control: Secondary | ICD-10-CM | POA: Diagnosis not present

## 2020-05-15 MED ORDER — BENZONATATE 100 MG PO CAPS: 100.0000 mg | ORAL_CAPSULE | Freq: Three times a day (TID) | ORAL | 0 refills | Status: DC | PRN

## 2020-05-15 MED ORDER — ACETAMINOPHEN 325 MG PO TABS
650.0000 mg | ORAL_TABLET | Freq: Once | ORAL | Status: AC
  Administered 2020-05-15: 650 mg via ORAL
  Filled 2020-05-15: qty 2

## 2020-05-15 MED ORDER — ACETAMINOPHEN 325 MG PO TABS
650.0000 mg | ORAL_TABLET | Freq: Once | ORAL | Status: AC | PRN
  Administered 2020-05-15: 650 mg via ORAL
  Filled 2020-05-15: qty 2

## 2020-05-15 NOTE — Discharge Instructions (Addendum)
You came to the emergency department today with reports of Covid-19 like symptoms.  These symptoms may be due to Covid 19 or another viral illness.  You have a COVID test pending; please use [facility] MyChart to find your results.  Please isolate at home while awaiting your results.  > If your test is negative, stay home until your fever has resolved/your symptoms are improving. > If your test is positive, isolate at home for at least 5 days after the day your symptoms initially began, and THEN at least 24 hours after you are fever-free without the help of medications (Tylenol/acetaminophen and Advil/ibuprofen/Motrin) AND your symptoms are improving.  You can alternate Tylenol/acetaminophen and Advil/ibuprofen/Motrin every 4 hours for sore throat, body aches, headache or fever.  Drink plenty of water.  Use saline nasal spray for congestion. You can take Tessalon every 8 hours as needed for cough. Wash your hands frequently. Please rest as needed with frequent repositioning and ambulation as tolerated.    If your symptoms do not improve please follow-up with your primary care provider or urgent care.  Return to the ER for significant shortness of breath, uncontrollable vomiting, severe chest pain, inability to tolerate fluids, changes in mental status such as confusion or other concerning symptoms.

## 2020-05-15 NOTE — ED Triage Notes (Signed)
Patient c/o headache, body aches, chills, sore throat, headache, and N/V since this AM.

## 2020-05-15 NOTE — ED Provider Notes (Addendum)
[facility]-EMERGENCY DEPT Provider Note   CSN: [id number] Arrival date & time: 05/15/20  1213     History Chief Complaint  Patient presents with  . Sore Throat  . Chills  . Generalized Body Aches  . Headache    Dana Solomon is a 25 y.o. female with a history of gestational hypertension and gestational diabetes.  Patient presents with a chief complaint of sore throat, chills, myalgias, and headache.  Patient reports that her symptoms began this morning, have been consistent, reports temporary relief after receiving Tylenol, no worsening factors.  Patient denies any known sick contacts.  Patient denies being vaccinated for COVID-19 or influenza.  Patient also endorses sore throat, nausea, one episode of vomiting, productive cough, and fatigue.  Patient denies any hematochezia or coffee-ground emesis.    Patient states that her headache feels like headaches she has had previously.  Pain had a gradual onset, pain was not maximal at onset, pain was improved with Tylenol.    Patient denies any shortness of breath, chest pain, lower extremity swelling, dysuria, syncope.      HPI     Past Medical History:  Diagnosis Date  . Gestational diabetes    first pregnancy  . Hypertension   . Medical history non-contributory   . Pregnancy induced hypertension     Patient Active Problem List   Diagnosis Date Noted  . Gestational hypertension 03/19/2020  . Acute blood loss anemia 03/19/2020  . SVD (spontaneous vaginal delivery) 11/25 03/18/2020  . Postpartum care following vaginal delivery 11/25 03/18/2020  . History of severe pre-eclampsia 03/17/2020  . Maternal obesity affecting pregnancy, antepartum 03/17/2020    Past Surgical History:  Procedure Laterality Date  . Belly button      approx 10 years ago  . naval     age 4y     OB History    Gravida  2   Para  2   Term  1   Preterm  1   AB      Living  2     SAB      IAB      Ectopic       Multiple  0   Live Births  2           Family History  Problem Relation Age of Onset  . Hypertension Father     Social History   Tobacco Use  . Smoking status: Never Smoker  . Smokeless tobacco: Never Used  Vaping Use  . Vaping Use: Never used  Substance Use Topics  . Alcohol use: Not Currently  . Drug use: Never    Home Medications Prior to Admission medications   Medication Sig Start Date End Date Taking? Authorizing Provider  ibuprofen (ADVIL) 600 MG tablet Take 1 tablet (600 mg total) by mouth every 6 (six) hours. 03/20/20   Dale [place], FNP  iron polysaccharides (NIFEREX) 150 MG capsule Take 1 capsule (150 mg total) by mouth daily. 03/21/20   Dale [place], FNP  Magnesium Oxide (MAG-OXIDE) 200 MG TABS Take 2 tablets (400 mg total) by mouth at bedtime. If that amount causes loose stools in the am, switch to 200mg  daily at bedtime. 01/30/20   03/31/20, CNM  ondansetron (ZOFRAN ODT) 4 MG disintegrating tablet Take 1 tablet (4 mg total) by mouth every 8 (eight) hours as needed for nausea or vomiting. 10/20/19   10/22/19, CNM  Prenatal Vit-Fe Fumarate-FA (PRENATAL MULTIVITAMIN) TABS tablet Take 1 tablet  by mouth daily at 12 noon.    [provider]    Allergies    Patient has no known allergies.  Review of Systems   Review of Systems  Constitutional: Positive for chills, fatigue and fever.  HENT: Positive for sore throat.   Eyes: Negative for visual disturbance.  Respiratory: Positive for cough (productive). Negative for shortness of breath.   Cardiovascular: Negative for chest pain and leg swelling.  Gastrointestinal: Positive for nausea and vomiting. Negative for abdominal pain.  Genitourinary: Negative for difficulty urinating and dysuria.  Musculoskeletal: Positive for back pain (generalized, reports hx of back spasms ). Negative for neck pain.  Skin: Negative for color change and rash.  Neurological: Negative for dizziness, syncope,  light-headedness and headaches.  Psychiatric/Behavioral: Negative for confusion.    Physical Exam Updated Vital Signs BP (!) 143/84 (BP Location: Left Arm)   Pulse (!) 112   Temp 99.6 F (37.6 C) (Oral)   Resp 20   Ht 5' 4.5" (1.638 m)   Wt 129.3 kg   LMP 05/01/2020   SpO2 100%   BMI 48.16 kg/m   Physical Exam Vitals and nursing note reviewed.  Constitutional:      General: She is not in acute distress.    Appearance: She is obese. She is not toxic-appearing or diaphoretic.  HENT:     Head: Normocephalic.     Mouth/Throat:     Mouth: Mucous membranes are moist.     Tongue: No lesions.     Palate: Lesions present.     Pharynx: Uvula midline. Pharyngeal swelling and posterior oropharyngeal erythema present. No oropharyngeal exudate or uvula swelling.     Tonsils: No tonsillar exudate or tonsillar abscesses. 3+ on the right. 3+ on the left.  Eyes:     General: No scleral icterus.       Right eye: No discharge.        Left eye: No discharge.     Extraocular Movements: Extraocular movements intact.     Pupils: Pupils are equal, round, and reactive to light.  Cardiovascular:     Rate and Rhythm: Normal rate.  Pulmonary:     Effort: Pulmonary effort is normal. No respiratory distress.     Breath sounds: No stridor. No wheezing, rhonchi or rales.  Chest:     Chest wall: Tenderness present.  Abdominal:     General: There is no distension.     Palpations: Abdomen is soft. There is no mass.     Tenderness: There is no abdominal tenderness.  Musculoskeletal:     Cervical back: Normal range of motion and neck supple. No rigidity, tenderness or bony tenderness. No spinous process tenderness.     Thoracic back: Spasms and tenderness present. No bony tenderness.     Lumbar back: Spasms and tenderness present. No bony tenderness.     Right lower leg: No edema.     Left lower leg: No edema.  Skin:    General: Skin is warm and dry.  Neurological:     General: No focal deficit  present.     Mental Status: She is alert.     GCS: GCS eye subscore is 4. GCS verbal subscore is 5. GCS motor subscore is 6.     Cranial Nerves: No cranial nerve deficit or facial asymmetry.     Sensory: Sensation is intact.     Motor: No weakness, tremor, seizure activity or pronator drift.     Coordination: Finger-Nose-Finger Test normal.  Comments: CN II-XII intact, equal grip strength, +5 strength to bilateral upper and lower extremities   Psychiatric:        Behavior: Behavior is cooperative.     ED Results / Procedures / Treatments   Labs (all labs ordered are listed, but only abnormal results are displayed) Labs Reviewed  POC SARS CORONAVIRUS 2 AG -  ED    EKG None  Radiology No results found.  Procedures Procedures (including critical care time)  Medications Ordered in ED Medications  acetaminophen (TYLENOL) tablet 650 mg (650 mg Oral Given 05/15/20 1244)    ED Course  I have reviewed the triage vital signs and the nursing notes.  Pertinent labs & imaging results that were available during my care of the patient were reviewed by me and considered in my medical decision making (see chart for details).    MDM Rules/Calculators/A&P                          Alert 25 year old female, in no acute distress, nontoxic appearing.  Patient presents with complaints of myalgias, chills, sore throat, headache, cough, nausea, vomiting.  Patient denies any known sick contacts.  Patient denies COVID-19 or influenza vaccination.    No tonsillar exudate noted, cough present; less likely strep pharyngitis.  Headache had gradual onset, pain not maximal at onset, not the worst headache pf patient's life; no concern for [facility].  No shortness of breath, chest pain, hemoptysis, recent surgery, recent immobilization, active cancer treatment, hormone therapy, signs or symptoms of DVT; less concerning for PE.  Patient was found to be febrile and tachycardic upon arrival emergency  department.  Both temperature and heart rate improved after receiving Tylenol.  Patient symptoms likely due to acute viral illness.  Will test patient for COVID-19.  Due to patient's obesity referral to ambulatory COVID-19 treatment was placed.  Patient prescribed Tessalon to help with her cough.  Patient was not given Zofran due to current breast-feeding.  Discussed findings, treatment and follow up. Patient advised of return precautions. Patient verbalized understanding and agreed with plan.   Dana Solomon was evaluated in Emergency Department on 05/16/2020 for the symptoms described in the history of present illness. She was evaluated in the context of the global COVID-19 pandemic, which necessitated consideration that the patient might be at risk for infection with the SARS-CoV-2 virus that causes COVID-19. Institutional protocols and algorithms that pertain to the evaluation of patients at risk for COVID-19 are in a state of rapid change based on information released by regulatory bodies including the CDC and federal and state organizations. These policies and algorithms were followed during the patient's care in the ED.   Final Clinical Impression(s) / ED Diagnoses Final diagnoses:  Influenza-like illness    Rx / DC Orders ED Discharge Orders         Ordered    Ambulatory referral for Covid Treatment        05/15/20 1854    benzonatate (TESSALON) 100 MG capsule  Every 8 hours PRN,   Status:  Discontinued        05/15/20 1854    benzonatate (TESSALON) 100 MG capsule  Every 8 hours PRN        05/15/20 2010           Haskel Schroeder, PA-C 05/16/20 0154    Haskel Schroeder, PA-C 05/16/20 0237    Lorre Nick, MD 05/16/20 2236

## 2020-05-16 LAB — SARS CORONAVIRUS 2 (TAT 6-24 HRS): SARS Coronavirus 2: NEGATIVE

## 2020-05-17 ENCOUNTER — Telehealth: Payer: Self-pay | Admitting: Physician Assistant

## 2020-05-17 NOTE — Telephone Encounter (Signed)
Called to discuss with patient about COVID-19 symptoms and the use of one of the available treatments for those with mild to moderate Covid symptoms and at a high risk of hospitalization.  Pt appears to qualify for outpatient treatment due to co-morbid conditions and/or a member of an at-risk group in accordance with the FDA Emergency Use Authorization.   Qualifiers: BMI, gestational DM and HTN. Seems 9 weeks post partum   Unable to reach pt. LVMTCB.   [employer]

## 2020-05-18 ENCOUNTER — Telehealth: Payer: Self-pay

## 2020-05-18 NOTE — Telephone Encounter (Signed)
Transition Care Management Unsuccessful Follow-up Telephone Call  Date of discharge and from where:  05/15/2020 [facility] ED  Attempts:  1st Attempt  Reason for unsuccessful TCM follow-up call:  Left voice message

## 2020-05-21 NOTE — Telephone Encounter (Signed)
Transition Care Management Follow-up Telephone Call  Date of discharge and from where: 05/15/2020 [facility] ED  How have you been since you were released from the hospital? Doing a lot better.   Any questions or concerns? Yes  Items Reviewed:  Did the pt receive and understand the discharge instructions provided? Yes   Medications obtained and verified? Yes   Other? No   Any new allergies since your discharge? No   Dietary orders reviewed? Yes  Do you have support at home? Yes    Functional Questionnaire: (I = Independent and D = Dependent) ADLs: I  Bathing/Dressing- I  Meal Prep- I  Eating- I  Maintaining continence- I  Transferring/Ambulation- I  Managing Meds- I  Follow up appointments reviewed:   PCP Hospital f/u appt confirmed? Yes  Patient confirmed PCP is [facility]. Patient stated she has an upcoming appointment, was not able to tell me date and time.   Specialist Hospital f/u appt confirmed? No    Are transportation arrangements needed? No   If their condition worsens, is the pt aware to call PCP or go to the Emergency Dept.? Yes  Was the patient provided with contact information for the PCP's office or ED? Yes  Was to pt encouraged to call back with questions or concerns? Yes

## 2020-05-25 DIAGNOSIS — Z419 Encounter for procedure for purposes other than remedying health state, unspecified: Secondary | ICD-10-CM | POA: Diagnosis not present

## 2020-06-03 ENCOUNTER — Ambulatory Visit (HOSPITAL_COMMUNITY): Payer: Self-pay

## 2020-06-05 DIAGNOSIS — N760 Acute vaginitis: Secondary | ICD-10-CM | POA: Diagnosis not present

## 2020-06-05 DIAGNOSIS — Z113 Encounter for screening for infections with a predominantly sexual mode of transmission: Secondary | ICD-10-CM | POA: Diagnosis not present

## 2020-06-05 DIAGNOSIS — B9689 Other specified bacterial agents as the cause of diseases classified elsewhere: Secondary | ICD-10-CM | POA: Diagnosis not present

## 2020-06-05 DIAGNOSIS — Z3202 Encounter for pregnancy test, result negative: Secondary | ICD-10-CM | POA: Diagnosis not present

## 2020-06-07 DIAGNOSIS — Z113 Encounter for screening for infections with a predominantly sexual mode of transmission: Secondary | ICD-10-CM | POA: Diagnosis not present

## 2020-06-22 DIAGNOSIS — Z419 Encounter for procedure for purposes other than remedying health state, unspecified: Secondary | ICD-10-CM | POA: Diagnosis not present

## 2020-07-23 DIAGNOSIS — Z419 Encounter for procedure for purposes other than remedying health state, unspecified: Secondary | ICD-10-CM | POA: Diagnosis not present

## 2020-08-22 DIAGNOSIS — Z419 Encounter for procedure for purposes other than remedying health state, unspecified: Secondary | ICD-10-CM | POA: Diagnosis not present

## 2020-09-17 ENCOUNTER — Emergency Department (HOSPITAL_COMMUNITY)
Admission: EM | Admit: 2020-09-17 | Discharge: 2020-09-17 | Discharge status: Against Medical Advice | Payer: Medicaid Other | Attending: Emergency Medicine | Admitting: Emergency Medicine

## 2020-09-17 ENCOUNTER — Other Ambulatory Visit: Payer: Self-pay

## 2020-09-17 ENCOUNTER — Encounter (HOSPITAL_COMMUNITY): Payer: Self-pay

## 2020-09-17 DIAGNOSIS — R1030 Lower abdominal pain, unspecified: Secondary | ICD-10-CM | POA: Diagnosis not present

## 2020-09-17 DIAGNOSIS — R109 Unspecified abdominal pain: Secondary | ICD-10-CM | POA: Diagnosis not present

## 2020-09-17 DIAGNOSIS — Z5321 Procedure and treatment not carried out due to patient leaving prior to being seen by health care provider: Secondary | ICD-10-CM | POA: Insufficient documentation

## 2020-09-17 LAB — URINALYSIS, ROUTINE W REFLEX MICROSCOPIC
Bilirubin Urine: NEGATIVE
Glucose, UA: NEGATIVE mg/dL
Hgb urine dipstick: NEGATIVE
Ketones, ur: NEGATIVE mg/dL
Nitrite: NEGATIVE
Protein, ur: NEGATIVE mg/dL
Specific Gravity, Urine: 1.011 (ref 1.005–1.030)
pH: 6 (ref 5.0–8.0)

## 2020-09-17 LAB — CBC WITH DIFFERENTIAL/PLATELET
Abs Immature Granulocytes: 0 10*3/uL (ref 0.00–0.07)
Basophils Absolute: 0 10*3/uL (ref 0.0–0.1)
Basophils Relative: 1 %
Eosinophils Absolute: 0.2 10*3/uL (ref 0.0–0.5)
Eosinophils Relative: 4 %
HCT: 38.7 % (ref 36.0–46.0)
Hemoglobin: 12 g/dL (ref 12.0–15.0)
Immature Granulocytes: 0 %
Lymphocytes Relative: 43 %
Lymphs Abs: 1.5 10*3/uL (ref 0.7–4.0)
MCH: 23 pg — ABNORMAL LOW (ref 26.0–34.0)
MCHC: 31 g/dL (ref 30.0–36.0)
MCV: 74.1 fL — ABNORMAL LOW (ref 80.0–100.0)
Monocytes Absolute: 0.5 10*3/uL (ref 0.1–1.0)
Monocytes Relative: 15 %
Neutro Abs: 1.3 10*3/uL — ABNORMAL LOW (ref 1.7–7.7)
Neutrophils Relative %: 37 %
Platelets: 306 10*3/uL (ref 150–400)
RBC: 5.22 MIL/uL — ABNORMAL HIGH (ref 3.87–5.11)
RDW: 14.9 % (ref 11.5–15.5)
WBC: 3.6 10*3/uL — ABNORMAL LOW (ref 4.0–10.5)
nRBC: 0 % (ref 0.0–0.2)

## 2020-09-17 LAB — COMPREHENSIVE METABOLIC PANEL
ALT: 18 U/L (ref 0–44)
AST: 20 U/L (ref 15–41)
Albumin: 3.7 g/dL (ref 3.5–5.0)
Alkaline Phosphatase: 52 U/L (ref 38–126)
Anion gap: 7 (ref 5–15)
BUN: 5 mg/dL — ABNORMAL LOW (ref 6–20)
CO2: 23 mmol/L (ref 22–32)
Calcium: 9.3 mg/dL (ref 8.9–10.3)
Chloride: 105 mmol/L (ref 98–111)
Creatinine, Ser: 0.78 mg/dL (ref 0.44–1.00)
GFR, Estimated: >60 mL/min (ref >60)
Glucose, Bld: 133 mg/dL — ABNORMAL HIGH (ref 70–99)
Potassium: 3.9 mmol/L (ref 3.5–5.1)
Sodium: 135 mmol/L (ref 135–145)
Total Bilirubin: 0.6 mg/dL (ref 0.3–1.2)
Total Protein: 7.2 g/dL (ref 6.5–8.1)

## 2020-09-17 LAB — I-STAT BETA HCG BLOOD, ED (MC, WL, AP ONLY): I-stat hCG, quantitative: <5 m[IU]/mL (ref <5)

## 2020-09-17 LAB — LIPASE, BLOOD: Lipase: 32 U/L (ref 11–51)

## 2020-09-17 LAB — HIV ANTIBODY (ROUTINE TESTING W REFLEX): HIV Screen 4th Generation wRfx: NONREACTIVE

## 2020-09-17 NOTE — ED Notes (Signed)
Called pt to take back to room no answer and I do not see pt in lobby, Bathroom, no outside,

## 2020-09-17 NOTE — ED Triage Notes (Signed)
Pt reports lower abd pain. Pt reports new sex partners.

## 2020-09-17 NOTE — ED Provider Notes (Signed)
Emergency Medicine Provider Triage Evaluation Note  Dana Solomon , a 25 y.o. female  was evaluated in triage.  Pt complains of diffuse abd pain. Intermittent over last few days. Some nausea without emesis. New sexual partner. Intermittently uses protection. Diarrhea 2 weeks ago. None recently  Review of Systems  Positive: abd pain, vag dishcarge Negative: Back pain, dysuria   Physical Exam  BP 139/86 (BP Location: Left Arm)   Pulse (!) 109   Temp 98.9 F (37.2 C)   Resp 18   SpO2 98%  Gen:   Awake, no distress   Resp:  Normal effort  MSK:   Moves extremities without difficulty  Other:    Medical Decision Making  Medically screening exam initiated at 12:16 PM.  Appropriate orders placed.  Alycen Mack was informed that the remainder of the evaluation will be completed by another provider, this initial triage assessment does not replace that evaluation, and the importance of remaining in the ED until their evaluation is complete.  abd pain new vag dc,, labs order, stable   Gerrianne Aydelott A, PA-C 09/17/20 1217    Derwood Kaplan, MD 09/20/20 1546

## 2020-09-18 LAB — RPR: RPR Ser Ql: NONREACTIVE

## 2020-09-20 ENCOUNTER — Telehealth: Payer: Self-pay

## 2020-09-20 NOTE — Telephone Encounter (Signed)
Transition Care Management Unsuccessful Follow-up Telephone Call  Date of discharge and from where:  09/17/20 from   Attempts:  1st Attempt  Reason for unsuccessful TCM follow-up call:  Left voice message     

## 2020-09-21 NOTE — Telephone Encounter (Signed)
Transition Care Management Unsuccessful Follow-up Telephone Call  Date of discharge and from where:  09/17/2020 from [facility]  Attempts:  2nd Attempt  Reason for unsuccessful TCM follow-up call:  Left voice message     

## 2020-09-22 DIAGNOSIS — Z419 Encounter for procedure for purposes other than remedying health state, unspecified: Secondary | ICD-10-CM | POA: Diagnosis not present

## 2020-09-22 NOTE — Telephone Encounter (Signed)
Transition Care Management Unsuccessful Follow-up Telephone Call  Date of discharge and from where:  09/17/2020 from [facility]  Attempts:  3rd Attempt  Reason for unsuccessful TCM follow-up call:  Unable to reach patient     

## 2020-10-22 DIAGNOSIS — Z419 Encounter for procedure for purposes other than remedying health state, unspecified: Secondary | ICD-10-CM | POA: Diagnosis not present

## 2020-10-26 DIAGNOSIS — Z0001 Encounter for general adult medical examination with abnormal findings: Secondary | ICD-10-CM | POA: Diagnosis not present

## 2020-10-26 DIAGNOSIS — Z113 Encounter for screening for infections with a predominantly sexual mode of transmission: Secondary | ICD-10-CM | POA: Diagnosis not present

## 2020-10-26 DIAGNOSIS — Z0000 Encounter for general adult medical examination without abnormal findings: Secondary | ICD-10-CM | POA: Diagnosis not present

## 2020-10-26 DIAGNOSIS — Z3040 Encounter for surveillance of contraceptives, unspecified: Secondary | ICD-10-CM | POA: Diagnosis not present

## 2020-10-26 DIAGNOSIS — Z124 Encounter for screening for malignant neoplasm of cervix: Secondary | ICD-10-CM | POA: Diagnosis not present

## 2020-10-26 DIAGNOSIS — Z6841 Body Mass Index (BMI) 40.0 and over, adult: Secondary | ICD-10-CM | POA: Diagnosis not present

## 2020-10-26 DIAGNOSIS — Z01419 Encounter for gynecological examination (general) (routine) without abnormal findings: Secondary | ICD-10-CM | POA: Diagnosis not present

## 2020-11-22 DIAGNOSIS — Z419 Encounter for procedure for purposes other than remedying health state, unspecified: Secondary | ICD-10-CM | POA: Diagnosis not present

## 2020-11-30 DIAGNOSIS — R895 Abnormal microbiological findings in specimens from other organs, systems and tissues: Secondary | ICD-10-CM | POA: Diagnosis not present

## 2020-11-30 DIAGNOSIS — B009 Herpesviral infection, unspecified: Secondary | ICD-10-CM | POA: Diagnosis not present

## 2020-11-30 DIAGNOSIS — A5600 Chlamydial infection of lower genitourinary tract, unspecified: Secondary | ICD-10-CM | POA: Diagnosis not present

## 2020-12-23 DIAGNOSIS — Z419 Encounter for procedure for purposes other than remedying health state, unspecified: Secondary | ICD-10-CM | POA: Diagnosis not present

## 2021-01-22 DIAGNOSIS — Z419 Encounter for procedure for purposes other than remedying health state, unspecified: Secondary | ICD-10-CM | POA: Diagnosis not present

## 2021-02-07 ENCOUNTER — Other Ambulatory Visit: Payer: Self-pay

## 2021-02-07 ENCOUNTER — Inpatient Hospital Stay (HOSPITAL_COMMUNITY)
Admission: AD | Admit: 2021-02-07 | Discharge: 2021-02-07 | Disposition: A | Discharge status: Home / Self Care | Payer: Medicaid Other | Attending: Obstetrics and Gynecology | Admitting: Obstetrics and Gynecology

## 2021-02-07 ENCOUNTER — Inpatient Hospital Stay (HOSPITAL_COMMUNITY): Payer: Medicaid Other

## 2021-02-07 ENCOUNTER — Encounter (HOSPITAL_COMMUNITY): Payer: Self-pay | Admitting: Obstetrics and Gynecology

## 2021-02-07 DIAGNOSIS — Z8249 Family history of ischemic heart disease and other diseases of the circulatory system: Secondary | ICD-10-CM | POA: Diagnosis not present

## 2021-02-07 DIAGNOSIS — Z6791 Unspecified blood type, Rh negative: Secondary | ICD-10-CM

## 2021-02-07 DIAGNOSIS — Z3A10 10 weeks gestation of pregnancy: Secondary | ICD-10-CM | POA: Insufficient documentation

## 2021-02-07 DIAGNOSIS — O26899 Other specified pregnancy related conditions, unspecified trimester: Secondary | ICD-10-CM

## 2021-02-07 DIAGNOSIS — Z3A01 Less than 8 weeks gestation of pregnancy: Secondary | ICD-10-CM | POA: Diagnosis not present

## 2021-02-07 DIAGNOSIS — O360110 Maternal care for anti-D [Rh] antibodies, first trimester, not applicable or unspecified: Secondary | ICD-10-CM | POA: Diagnosis not present

## 2021-02-07 DIAGNOSIS — O468X1 Other antepartum hemorrhage, first trimester: Secondary | ICD-10-CM

## 2021-02-07 DIAGNOSIS — O209 Hemorrhage in early pregnancy, unspecified: Secondary | ICD-10-CM

## 2021-02-07 DIAGNOSIS — O208 Other hemorrhage in early pregnancy: Secondary | ICD-10-CM | POA: Insufficient documentation

## 2021-02-07 DIAGNOSIS — O418X10 Other specified disorders of amniotic fluid and membranes, first trimester, not applicable or unspecified: Secondary | ICD-10-CM

## 2021-02-07 LAB — URINALYSIS, ROUTINE W REFLEX MICROSCOPIC
Bacteria, UA: NONE SEEN
Bilirubin Urine: NEGATIVE
Glucose, UA: NEGATIVE mg/dL
Hgb urine dipstick: NEGATIVE
Ketones, ur: 5 mg/dL — AB
Leukocytes,Ua: NEGATIVE
Nitrite: NEGATIVE
Protein, ur: 30 mg/dL — AB
Specific Gravity, Urine: 1.03 (ref 1.005–1.030)
pH: 5 (ref 5.0–8.0)

## 2021-02-07 LAB — POCT PREGNANCY, URINE: Preg Test, Ur: POSITIVE — AB

## 2021-02-07 MED ORDER — RHO D IMMUNE GLOBULIN 1500 UNIT/2ML IJ SOSY
300.0000 ug | PREFILLED_SYRINGE | Freq: Once | INTRAMUSCULAR | Status: AC
  Administered 2021-02-07: 300 ug via INTRAMUSCULAR
  Filled 2021-02-07: qty 2

## 2021-02-07 NOTE — MAU Provider Note (Signed)
History     CSN: [id number]  Arrival date and time: 02/07/21 8099   Event Date/Time   First Provider Initiated Contact with Patient 02/07/21 [phone]      Chief Complaint  Patient presents with   post coital bleeding   HPI This is a 25 year old G3 P1-1-0-2 at 10 weeks and 3 days who presents with bleeding after sex.  Had intercourse this morning and had a fairly large amount of bleeding.  Bleeding has decreased to spotting now.  No palliating or provoking factors.  Denies fevers, chills, nausea, vomiting.  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 21 capsule 0    ibuprofen (ADVIL) 600 MG tablet Take 1 tablet (600 mg total) by mouth every 6 (six) hours. 30 tablet 0    iron polysaccharides (NIFEREX) 150 MG capsule Take 1 capsule (150 mg total) by mouth daily. 30 capsule 1    Magnesium Oxide (MAG-OXIDE) 200 MG TABS Take 2 tablets (400 mg total) by mouth at bedtime. If that amount causes loose stools in the am, switch to 200mg  daily at bedtime. 60 tablet 3     ondansetron (ZOFRAN ODT) 4 MG disintegrating tablet Take 1 tablet (4 mg total) by mouth every 8 (eight) hours as needed for nausea or vomiting. 20 tablet 0    Prenatal Vit-Fe Fumarate-FA (PRENATAL MULTIVITAMIN) TABS tablet Take 1 tablet by mouth daily at 12 noon.       Review of Systems Physical Exam   Blood pressure 140/79, pulse 92, temperature 98.4 F (36.9 C), temperature source Oral, resp. rate 18, height 5' 4.5" (1.638 m), weight 118.8 kg, last menstrual period 11/26/2020, SpO2 100 %, unknown if currently breastfeeding.  Physical Exam Vitals reviewed.  Constitutional:      Appearance: Normal appearance.  Cardiovascular:     Rate and Rhythm: Normal rate.  Pulmonary:     Effort: Pulmonary effort is normal.  Abdominal:     General: Abdomen is flat. There is no distension.     Palpations: Abdomen is soft.     Tenderness: There is no abdominal tenderness.  Skin:    General: Skin is warm  and dry.     Capillary Refill: Capillary refill takes less than 2 seconds.  Neurological:     General: No focal deficit present.     Mental Status: She is alert.  Psychiatric:        Mood and Affect: Mood normal.        Behavior: Behavior normal.        Thought Content: Thought content normal.        Judgment: Judgment normal.   US OB LESS THAN 14 WEEKS WITH OB TRANSVAGINAL  Result Date: 02/07/2021 CLINICAL DATA:  Bleeding for 1 day EXAM: OBSTETRIC <14 WK [place] AND TRANSVAGINAL OB US TECHNIQUE: Both transabdominal and transvaginal ultrasound examinations were performed for complete evaluation of the gestation as well as the maternal uterus, adnexal regions, and pelvic cul-de-sac. Transvaginal technique was performed to assess early pregnancy. COMPARISON:  None. FINDINGS: Intrauterine gestational sac: Single Yolk sac:  Visualized. Embryo:  Visualized. Cardiac Activity: Visualized. Heart Rate: 132 bpm CRL: 7.9 mm   6 w   4 d                  [place] EDC: 09/29/2021 Subchorionic hemorrhage:  Small  subchorionic hemorrhage identified. Maternal uterus/adnexae: The uterus and ovaries are unremarkable. The right ovary measures 3.1 cm x 2.1 cm x 2.5 cm, and the left ovary measures 3.4 cm x 0.9 cm x 2.2 cm. IMPRESSION: 1. Single live intrauterine pregnancy identified with an estimated gestational age of [redacted] weeks 4 days. 2. Small subchorionic hemorrhage. Electronically Signed   By: Lesia Hausen M.D.   On: 02/07/2021 11:14     MAU Course  Procedures Bedside ultrasound attempted.  Able to see gestational sac and embryo.  See heartrate clearly. Will get formal US.  MDM Imaging: I independent reviewed the images of the ultrasound. Moderate subchorionic hemorrhage seen, measuring 3.1 x 2.1 x 2.5 cm. EGA by CL is [redacted]w[redacted]d. FHR 132. Ovaries normal.    Assessment and Plan   1. [redacted] weeks gestation of pregnancy   2. Vaginal bleeding in pregnancy, first trimester   3. Subchorionic hematoma in first trimester, single or unspecified fetus   4. Rh negative state in antepartum period    Rhogam today for subchorionic hematoma. Pelvic rest for 2-3 weeks F/u with OB for new OB.  Levie Heritage 02/07/2021, 9:07 AM

## 2021-02-07 NOTE — Progress Notes (Signed)
  Chaplain paged to support Dana Solomon and her infant son. Pt reports feeling stress as the parent of an 27 year old and a 74 month old and this current pregnancy. Chaplain normalized the stress and anxiety of the situation and offered support by providing a safe environment for her son while she had her ultrasound.  Please page as further needs arise.  [name]. Carley Hammed, M.Div. [facility] Chaplain Pager [phone] Office [phone]        02/07/21 0915  Clinical Encounter Type  Visited With Patient;Family  Visit Type Initial;Spiritual support;Social support;Psychological support  Referral From Nurse  Stress Factors  Patient Stress Factors Exhausted

## 2021-02-07 NOTE — MAU Note (Signed)
Bleeding after intercourse this morning. +HPT x3.First OB appt is 10/21.  Denies pain.

## 2021-02-07 NOTE — MAU Note (Signed)
Child/visitor policy explained.  Needs someone to come get child, "there is no-one, her partner is at work'.

## 2021-02-08 LAB — RH IG WORKUP (INCLUDES ABO/RH)
ABO/RH(D): O NEG
Antibody Screen: NEGATIVE
Gestational Age(Wks): 6
Unit division: 0

## 2021-02-11 DIAGNOSIS — R030 Elevated blood-pressure reading, without diagnosis of hypertension: Secondary | ICD-10-CM | POA: Diagnosis not present

## 2021-02-11 DIAGNOSIS — Z3A01 Less than 8 weeks gestation of pregnancy: Secondary | ICD-10-CM | POA: Diagnosis not present

## 2021-02-11 DIAGNOSIS — O3680X9 Pregnancy with inconclusive fetal viability, other fetus: Secondary | ICD-10-CM | POA: Diagnosis not present

## 2021-02-11 DIAGNOSIS — N925 Other specified irregular menstruation: Secondary | ICD-10-CM | POA: Diagnosis not present

## 2021-02-11 DIAGNOSIS — Z113 Encounter for screening for infections with a predominantly sexual mode of transmission: Secondary | ICD-10-CM | POA: Diagnosis not present

## 2021-02-11 DIAGNOSIS — N912 Amenorrhea, unspecified: Secondary | ICD-10-CM | POA: Diagnosis not present

## 2021-02-11 DIAGNOSIS — Z8759 Personal history of other complications of pregnancy, childbirth and the puerperium: Secondary | ICD-10-CM | POA: Diagnosis not present

## 2021-02-11 LAB — HEPATITIS C ANTIBODY: HCV Ab: NEGATIVE

## 2021-02-11 LAB — OB RESULTS CONSOLE RUBELLA ANTIBODY, IGM: Rubella: IMMUNE

## 2021-02-11 LAB — OB RESULTS CONSOLE RPR: RPR: NONREACTIVE

## 2021-02-11 LAB — OB RESULTS CONSOLE HIV ANTIBODY (ROUTINE TESTING): HIV: NONREACTIVE

## 2021-02-11 LAB — OB RESULTS CONSOLE ABO/RH: RH Type: NEGATIVE

## 2021-02-11 LAB — OB RESULTS CONSOLE HEPATITIS B SURFACE ANTIGEN: Hepatitis B Surface Ag: NEGATIVE

## 2021-02-11 LAB — OB RESULTS CONSOLE ANTIBODY SCREEN: Antibody Screen: POSITIVE

## 2021-02-14 LAB — OB RESULTS CONSOLE GC/CHLAMYDIA
Chlamydia: NEGATIVE
Neisseria Gonorrhea: NEGATIVE

## 2021-02-20 IMAGING — US US OB COMP LESS 14 WK
1 series · 15 of 28 positions shown · non-contrast
Comparison: None.

CLINICAL DATA: Abdominal and back pain. Gestational age by last
menstrual period is 9 weeks 0 days.

EXAM:
OBSTETRIC <14 WK US AND TRANSVAGINAL OB US
TECHNIQUE: Both transabdominal and transvaginal ultrasound examinations were
performed for complete evaluation of the gestation as well as the
maternal uterus, adnexal regions, and pelvic cul-de-sac.
Transvaginal technique was performed to assess early pregnancy.

[Series 1: us ob comp less 14 wk · 15 of 28 slices shown]
[im 1/28]
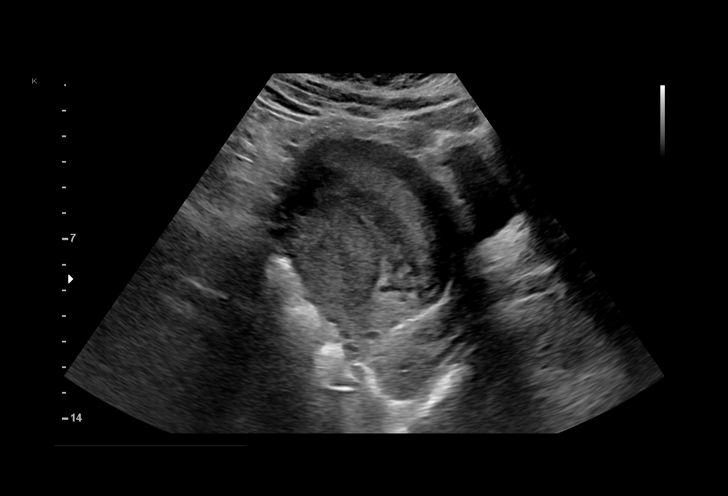
[im 3/28]
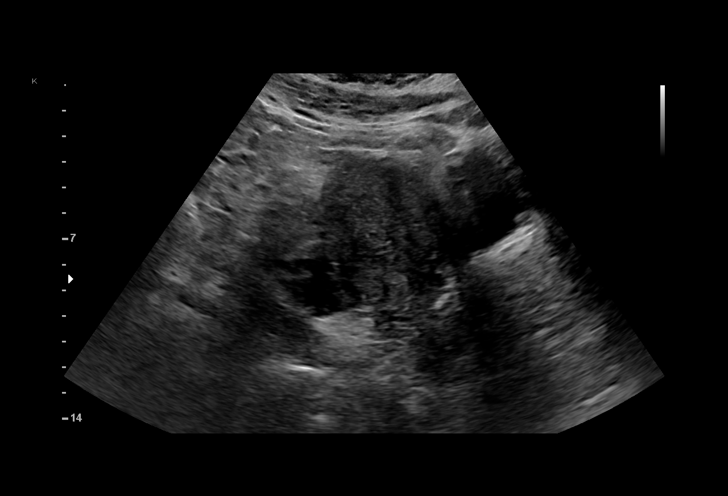
[im 5/28]
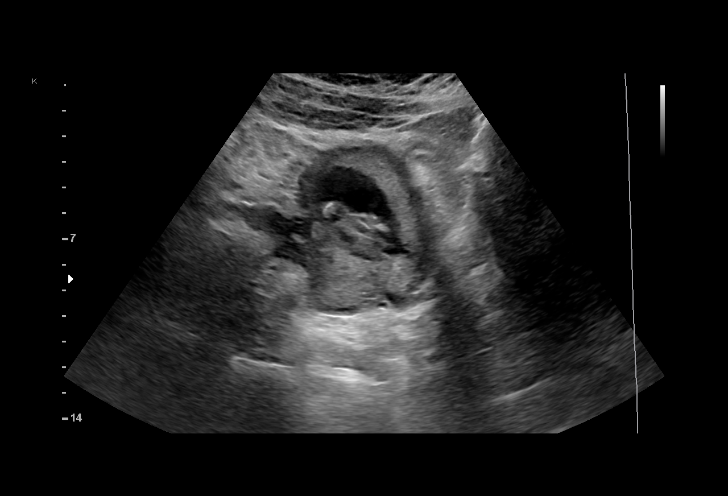
[im 7/28]
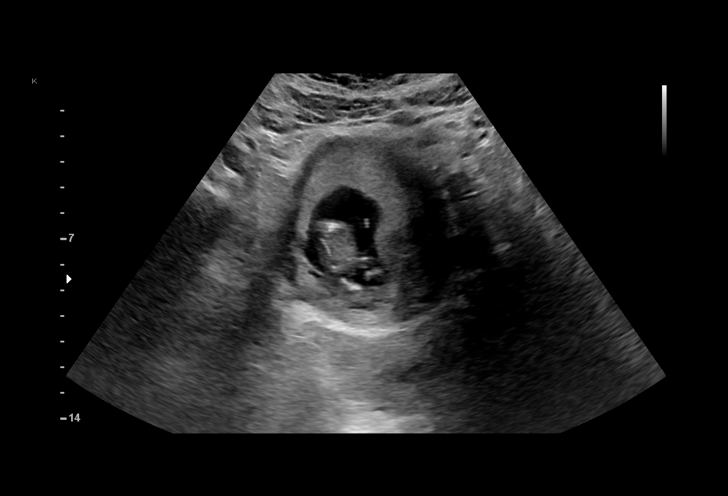
[im 9/28]
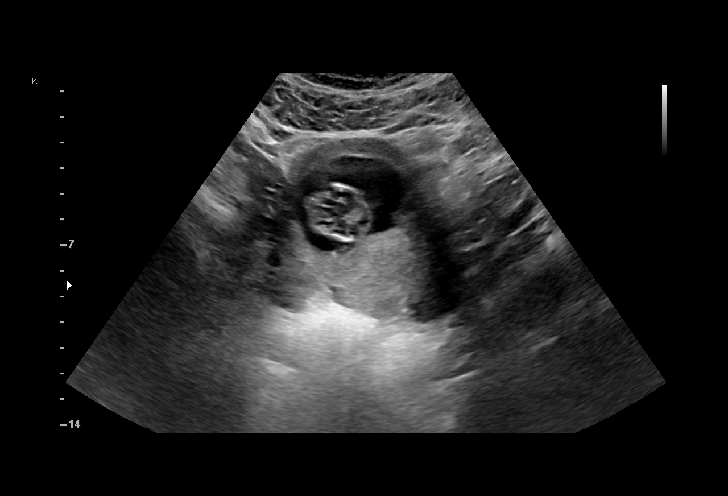
[im 11/28]
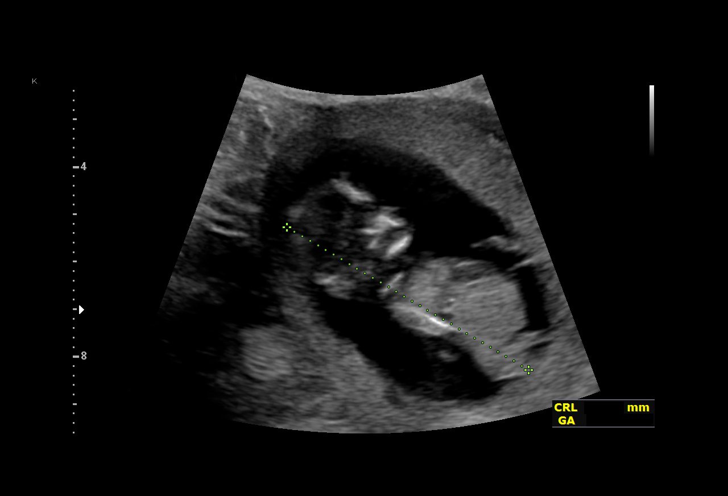
[im 13/28]
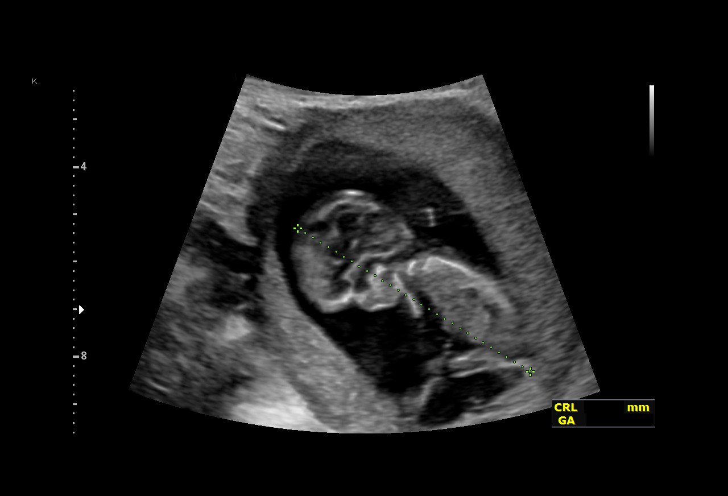
[im 15/28]
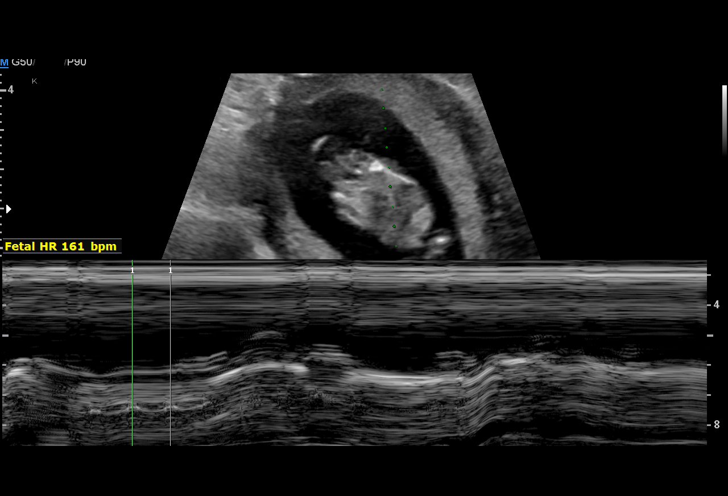
[im 16/28]
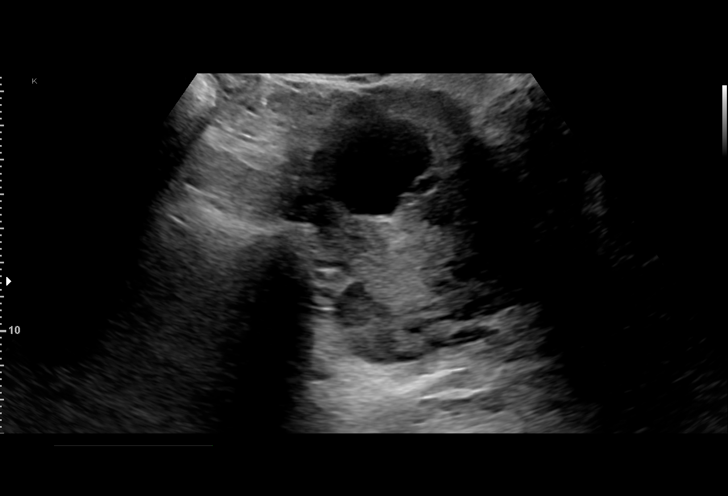
[im 18/28]
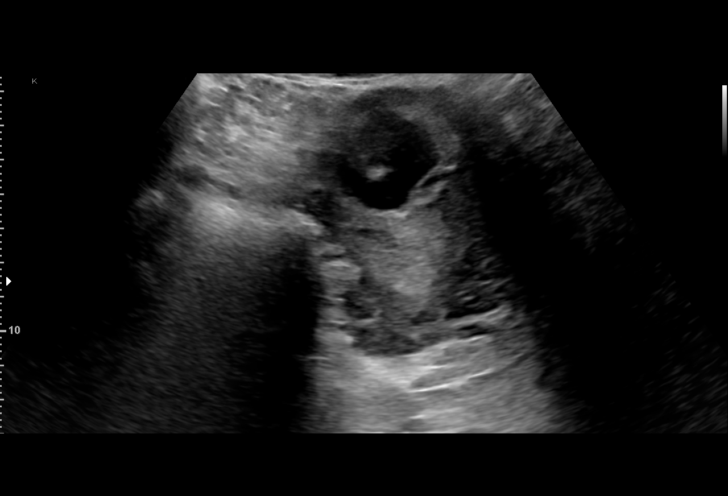
[im 20/28]
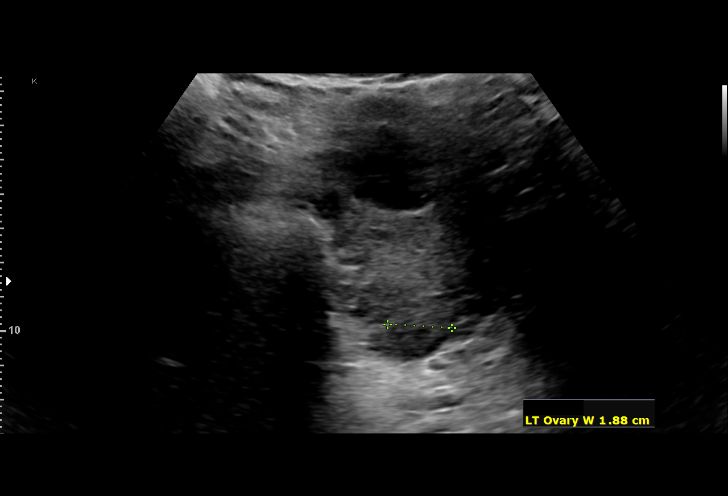
[im 22/28]
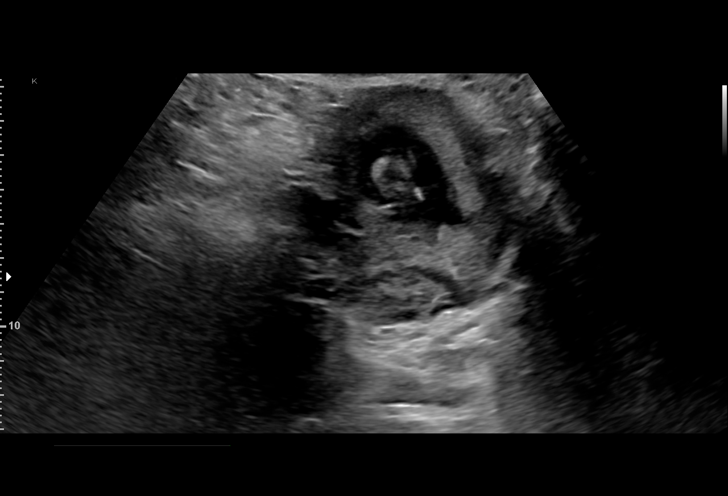
[im 24/28]
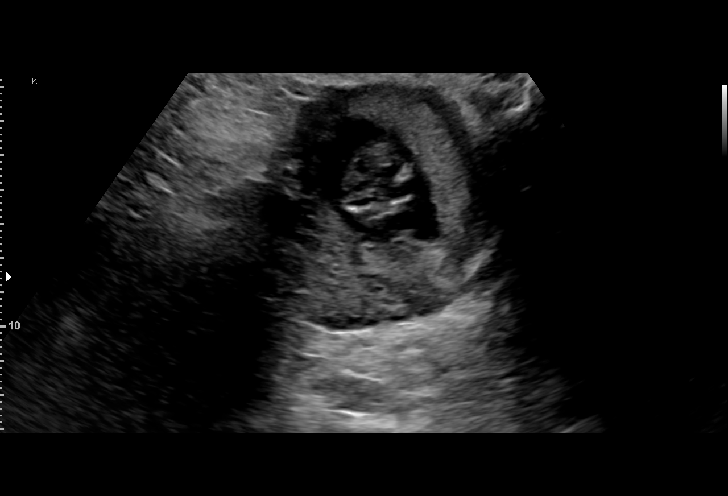
[im 26/28]
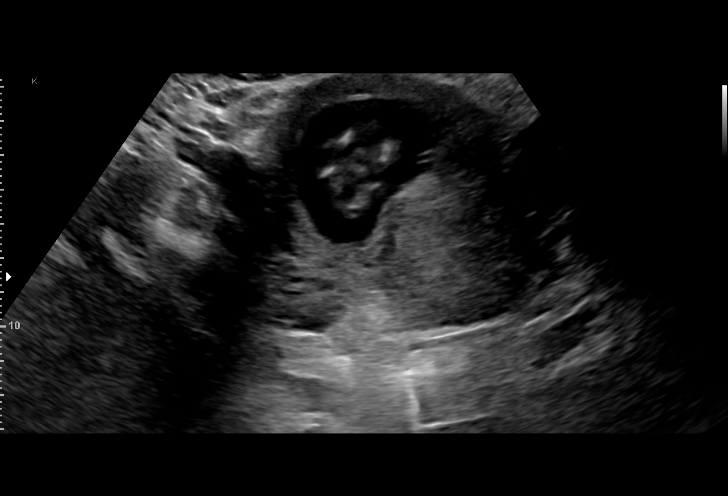
[im 28/28]
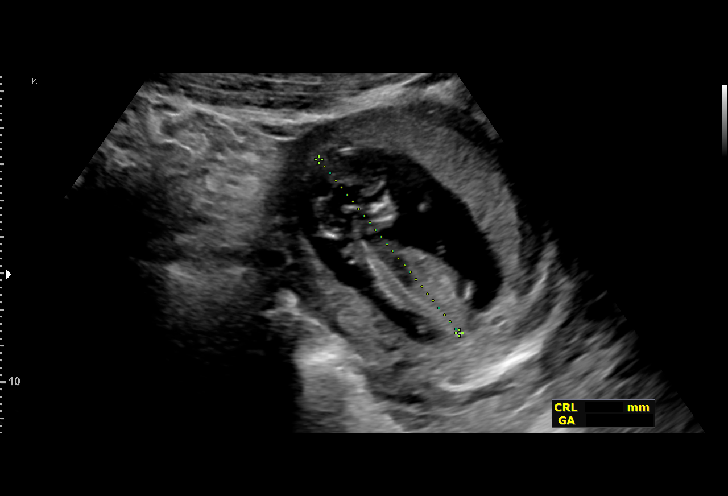

[15 of 28 positions shown; findings below may reference images not displayed]

FINDINGS: Intrauterine gestational sac: Single

Yolk sac:  Not Visualized.

Embryo:  Visualized.

Cardiac Activity: Visualized.

Heart Rate: 161 bpm

CRL:  59.9 mm   12 w   3 d                  US EDC: 03/22/2020

Subchorionic hemorrhage:  None visualized.

Maternal uterus/adnexae: Normal.
IMPRESSION: Single live intrauterine pregnancy.  No subchorionic hemorrhage.

## 2021-02-22 DIAGNOSIS — Z419 Encounter for procedure for purposes other than remedying health state, unspecified: Secondary | ICD-10-CM | POA: Diagnosis not present

## 2021-03-04 DIAGNOSIS — Z3491 Encounter for supervision of normal pregnancy, unspecified, first trimester: Secondary | ICD-10-CM | POA: Diagnosis not present

## 2021-03-24 DIAGNOSIS — Z419 Encounter for procedure for purposes other than remedying health state, unspecified: Secondary | ICD-10-CM | POA: Diagnosis not present

## 2021-04-19 DIAGNOSIS — Z3491 Encounter for supervision of normal pregnancy, unspecified, first trimester: Secondary | ICD-10-CM | POA: Diagnosis not present

## 2021-04-24 DIAGNOSIS — Z419 Encounter for procedure for purposes other than remedying health state, unspecified: Secondary | ICD-10-CM | POA: Diagnosis not present

## 2021-04-24 NOTE — L&D Delivery Note (Signed)
Delivery Note At 5:44 PM a viable female was delivered via  (Presentation: Right Occiput Anterior).  APGAR: 8, 9; weight  pending.    Placenta status: Spontaneous, Intact.  Cord: 3 vessels with the following complications: None. Anesthesia: Epidural Episiotomy: None Lacerations: None Suture Repair: N/A Est. Blood Loss (mL): 100  Mom to postpartum.  Baby to Couplet care / Skin to Skin.  Archie Endo, MD.  10/01/2021, 6:50 PM

## 2021-05-25 DIAGNOSIS — Z419 Encounter for procedure for purposes other than remedying health state, unspecified: Secondary | ICD-10-CM | POA: Diagnosis not present

## 2021-05-26 DIAGNOSIS — Z363 Encounter for antenatal screening for malformations: Secondary | ICD-10-CM | POA: Diagnosis not present

## 2021-05-26 DIAGNOSIS — Z3A22 22 weeks gestation of pregnancy: Secondary | ICD-10-CM | POA: Diagnosis not present

## 2021-05-26 DIAGNOSIS — Z3686 Encounter for antenatal screening for cervical length: Secondary | ICD-10-CM | POA: Diagnosis not present

## 2021-06-22 DIAGNOSIS — Z419 Encounter for procedure for purposes other than remedying health state, unspecified: Secondary | ICD-10-CM | POA: Diagnosis not present

## 2021-06-23 DIAGNOSIS — Z113 Encounter for screening for infections with a predominantly sexual mode of transmission: Secondary | ICD-10-CM | POA: Diagnosis not present

## 2021-07-12 ENCOUNTER — Other Ambulatory Visit: Payer: Self-pay | Admitting: Obstetrics and Gynecology

## 2021-07-12 DIAGNOSIS — Z369 Encounter for antenatal screening, unspecified: Secondary | ICD-10-CM | POA: Diagnosis not present

## 2021-07-12 DIAGNOSIS — Z363 Encounter for antenatal screening for malformations: Secondary | ICD-10-CM

## 2021-07-19 ENCOUNTER — Ambulatory Visit: Payer: Medicaid Other | Admitting: *Deleted

## 2021-07-19 ENCOUNTER — Ambulatory Visit: Payer: Medicaid Other | Attending: Obstetrics and Gynecology

## 2021-07-19 ENCOUNTER — Other Ambulatory Visit: Payer: Self-pay

## 2021-07-19 ENCOUNTER — Other Ambulatory Visit: Payer: Self-pay | Admitting: *Deleted

## 2021-07-19 ENCOUNTER — Encounter: Payer: Self-pay | Admitting: *Deleted

## 2021-07-19 VITALS — BP 108/70 | HR 98

## 2021-07-19 DIAGNOSIS — Z6841 Body Mass Index (BMI) 40.0 and over, adult: Secondary | ICD-10-CM | POA: Diagnosis not present

## 2021-07-19 DIAGNOSIS — Z362 Encounter for other antenatal screening follow-up: Secondary | ICD-10-CM

## 2021-07-19 DIAGNOSIS — Z363 Encounter for antenatal screening for malformations: Secondary | ICD-10-CM | POA: Diagnosis not present

## 2021-07-19 DIAGNOSIS — O09299 Supervision of pregnancy with other poor reproductive or obstetric history, unspecified trimester: Secondary | ICD-10-CM

## 2021-07-23 DIAGNOSIS — Z419 Encounter for procedure for purposes other than remedying health state, unspecified: Secondary | ICD-10-CM | POA: Diagnosis not present

## 2021-08-11 DIAGNOSIS — Z8759 Personal history of other complications of pregnancy, childbirth and the puerperium: Secondary | ICD-10-CM | POA: Diagnosis not present

## 2021-08-11 DIAGNOSIS — A5600 Chlamydial infection of lower genitourinary tract, unspecified: Secondary | ICD-10-CM | POA: Diagnosis not present

## 2021-08-11 DIAGNOSIS — Z23 Encounter for immunization: Secondary | ICD-10-CM | POA: Diagnosis not present

## 2021-08-11 DIAGNOSIS — Z6791 Unspecified blood type, Rh negative: Secondary | ICD-10-CM | POA: Diagnosis not present

## 2021-08-11 DIAGNOSIS — A599 Trichomoniasis, unspecified: Secondary | ICD-10-CM | POA: Diagnosis not present

## 2021-08-11 DIAGNOSIS — Z0183 Encounter for blood typing: Secondary | ICD-10-CM | POA: Diagnosis not present

## 2021-08-11 DIAGNOSIS — O99213 Obesity complicating pregnancy, third trimester: Secondary | ICD-10-CM | POA: Diagnosis not present

## 2021-08-11 DIAGNOSIS — O99019 Anemia complicating pregnancy, unspecified trimester: Secondary | ICD-10-CM | POA: Diagnosis not present

## 2021-08-16 ENCOUNTER — Ambulatory Visit: Payer: Medicaid Other

## 2021-08-18 DIAGNOSIS — Z8759 Personal history of other complications of pregnancy, childbirth and the puerperium: Secondary | ICD-10-CM | POA: Diagnosis not present

## 2021-08-18 DIAGNOSIS — Z3A34 34 weeks gestation of pregnancy: Secondary | ICD-10-CM | POA: Diagnosis not present

## 2021-08-18 DIAGNOSIS — O99213 Obesity complicating pregnancy, third trimester: Secondary | ICD-10-CM | POA: Diagnosis not present

## 2021-08-18 DIAGNOSIS — Z0183 Encounter for blood typing: Secondary | ICD-10-CM | POA: Diagnosis not present

## 2021-08-18 DIAGNOSIS — O99019 Anemia complicating pregnancy, unspecified trimester: Secondary | ICD-10-CM | POA: Diagnosis not present

## 2021-08-18 DIAGNOSIS — Z331 Pregnant state, incidental: Secondary | ICD-10-CM | POA: Diagnosis not present

## 2021-08-18 DIAGNOSIS — Z3A33 33 weeks gestation of pregnancy: Secondary | ICD-10-CM | POA: Diagnosis not present

## 2021-08-18 DIAGNOSIS — Z6841 Body Mass Index (BMI) 40.0 and over, adult: Secondary | ICD-10-CM | POA: Diagnosis not present

## 2021-08-18 DIAGNOSIS — Z3493 Encounter for supervision of normal pregnancy, unspecified, third trimester: Secondary | ICD-10-CM | POA: Diagnosis not present

## 2021-08-22 DIAGNOSIS — Z419 Encounter for procedure for purposes other than remedying health state, unspecified: Secondary | ICD-10-CM | POA: Diagnosis not present

## 2021-08-25 DIAGNOSIS — O99213 Obesity complicating pregnancy, third trimester: Secondary | ICD-10-CM | POA: Diagnosis not present

## 2021-08-25 DIAGNOSIS — Z3A35 35 weeks gestation of pregnancy: Secondary | ICD-10-CM | POA: Diagnosis not present

## 2021-08-30 DIAGNOSIS — Z113 Encounter for screening for infections with a predominantly sexual mode of transmission: Secondary | ICD-10-CM | POA: Diagnosis not present

## 2021-08-30 DIAGNOSIS — Z0183 Encounter for blood typing: Secondary | ICD-10-CM | POA: Diagnosis not present

## 2021-08-30 DIAGNOSIS — Z3493 Encounter for supervision of normal pregnancy, unspecified, third trimester: Secondary | ICD-10-CM | POA: Diagnosis not present

## 2021-08-30 DIAGNOSIS — Z8759 Personal history of other complications of pregnancy, childbirth and the puerperium: Secondary | ICD-10-CM | POA: Diagnosis not present

## 2021-08-30 DIAGNOSIS — Z331 Pregnant state, incidental: Secondary | ICD-10-CM | POA: Diagnosis not present

## 2021-08-30 DIAGNOSIS — Z3A35 35 weeks gestation of pregnancy: Secondary | ICD-10-CM | POA: Diagnosis not present

## 2021-08-30 DIAGNOSIS — Z6841 Body Mass Index (BMI) 40.0 and over, adult: Secondary | ICD-10-CM | POA: Diagnosis not present

## 2021-08-30 DIAGNOSIS — O99213 Obesity complicating pregnancy, third trimester: Secondary | ICD-10-CM | POA: Diagnosis not present

## 2021-09-03 LAB — OB RESULTS CONSOLE GBS: GBS: NEGATIVE

## 2021-09-13 DIAGNOSIS — Z3A37 37 weeks gestation of pregnancy: Secondary | ICD-10-CM | POA: Diagnosis not present

## 2021-09-13 DIAGNOSIS — O99213 Obesity complicating pregnancy, third trimester: Secondary | ICD-10-CM | POA: Diagnosis not present

## 2021-09-14 ENCOUNTER — Other Ambulatory Visit: Payer: Self-pay | Admitting: Obstetrics & Gynecology

## 2021-09-15 ENCOUNTER — Telehealth (HOSPITAL_COMMUNITY): Payer: Self-pay | Admitting: *Deleted

## 2021-09-16 ENCOUNTER — Encounter (HOSPITAL_COMMUNITY): Payer: Self-pay

## 2021-09-16 NOTE — Telephone Encounter (Signed)
Preadmission screen  

## 2021-09-20 ENCOUNTER — Encounter (HOSPITAL_COMMUNITY): Payer: Self-pay | Admitting: *Deleted

## 2021-09-20 ENCOUNTER — Telehealth (HOSPITAL_COMMUNITY): Payer: Self-pay | Admitting: *Deleted

## 2021-09-20 NOTE — Telephone Encounter (Signed)
Preadmission screen  

## 2021-09-22 DIAGNOSIS — Z419 Encounter for procedure for purposes other than remedying health state, unspecified: Secondary | ICD-10-CM | POA: Diagnosis not present

## 2021-09-23 DIAGNOSIS — O99213 Obesity complicating pregnancy, third trimester: Secondary | ICD-10-CM | POA: Diagnosis not present

## 2021-09-23 DIAGNOSIS — Z3A39 39 weeks gestation of pregnancy: Secondary | ICD-10-CM | POA: Diagnosis not present

## 2021-09-29 ENCOUNTER — Inpatient Hospital Stay (HOSPITAL_COMMUNITY): Payer: Medicaid Other

## 2021-09-29 NOTE — Progress Notes (Signed)
Dr Mora Appl returned call advised patient not coming for induction and I advised patient to call the office for a reschedule.

## 2021-09-29 NOTE — Progress Notes (Signed)
Called patient for induction patient stated she can not come today advised patient to call office to reschedule. Attempted to call Dr Mora Appl no answer.

## 2021-09-30 ENCOUNTER — Inpatient Hospital Stay (HOSPITAL_COMMUNITY): Payer: Medicaid Other

## 2021-09-30 NOTE — H&P (Addendum)
Dana Solomon is a 26 y.o. female presenting for G3P1102 at 40 weeks 1 day EGA here for induction of labor due to a pre-pregnancy BMI of 48 and current BMI of 46.  LMP 11/22/2020. EDC 09/29/2021 by first trimester ultrasound.    OB History     Gravida  3   Para  2   Term  1   Preterm  1   AB      Living  2      SAB      IAB      Ectopic      Multiple  0   Live Births  2          Past Medical History:  Diagnosis Date   Gestational diabetes    first pregnancy   History of pre-eclampsia in prior pregnancy, currently pregnant    Hypertension    Medical history non-contributory    Pregnancy induced hypertension    Past Surgical History:  Procedure Laterality Date   Belly button      approx 10 years ago   [facility]     age 29y   Family History: family history includes Diabetes in her father; Hypertension in her father. Social History:  reports that she has never smoked. She has never used smokeless tobacco. She reports that she does not currently use alcohol. She reports that she does not use drugs.     Maternal Diabetes: No Genetic Screening: Normal Maternal Ultrasounds/Referrals: Normal  EFW 09/23/2021: 9 lbs 2 oz (94th%).  Posterior placenta. BPP 8/8.  Cephalic.  Fetal Ultrasounds or other Referrals:  None Maternal Substance Abuse:  No Significant Maternal Medications:  Meds include: Other:  Significant Maternal Lab Results:  Group B Strep negative Other Comments:  None  Review of Systems Constitutional: Denies fevers/chills Cardiovascular: Denies chest pain or palpitations Pulmonary: Denies coughing or wheezing Gastrointestinal: Denies nausea, vomiting or diarrhea Genitourinary: Denies pelvic pain, unusual vaginal bleeding, unusual vaginal discharge, dysuria, urgency or frequency.  Musculoskeletal: Denies muscle or joint aches and pain.  Neurology: Denies abnormal sensations such as tingling or numbness.      Last menstrual period 11/26/2020, unknown if  currently breastfeeding. Exam Physical Exam  Constitutional: She is oriented to person, place, and time. She appears well-developed and well-nourished.  HENT:  Head: Normocephalic and atraumatic.  Neck: Normal range of motion.  Cardiovascular: Normal rate, regular rhythm and normal heart sounds.   Respiratory: Effort normal and breath sounds normal.  GI: Soft. Bowel sounds are normal.  Neurological: She is alert and oriented to person, place, and time.  Skin: Skin is warm and dry.  Psychiatric: She has a normal mood and affect. Her behavior is normal.   Cvx exam at office: 0/30%/-3.   Prenatal labs: ABO, Rh: O/Negative/-- (10/21 0000) Antibody: Positive (10/21 0000) Rubella: Immune (10/21 0000) RPR: Nonreactive (10/21 0000)  HBsAg: Negative (10/21 0000)  HIV: Non-reactive (10/21 0000)  GBS: Negative/-- (05/13 0000)   Current Outpatient Medications  Medication Instructions   benzonatate (TESSALON) 100 mg, Oral, Every 8 hours PRN   iron polysaccharides (NIFEREX) 150 mg, Oral, Daily   Mag-Oxide 400 mg, Oral, Daily at bedtime, If that amount causes loose stools in the am, switch to 200mg  daily at bedtime.   ondansetron (ZOFRAN ODT) 4 mg, Oral, Every 8 hours PRN   Prenatal Vit-Fe Fumarate-FA (PRENATAL MULTIVITAMIN) TABS tablet 1 tablet, Oral, Daily    Assessment/Plan: 26 y/o G3P1102 at [redacted] weeks EGA here for induction of labor for  high BMI, - Admit to labor and delivery. - I discussed with patient risks, benefits and alternatives of labor induction including risks of cesarean delivery.  We discussed risks of induction agents including effects on fetal heart beat, contraction pattern and need for close monitoring.  Patient expressed understanding of all this and desired to proceed with the induction.   - Patient desires postpartum tubal ligation and signed tubal consent form on 07/13/21.    [name]. MD.  09/30/2021, 6:59 PM

## 2021-10-01 ENCOUNTER — Inpatient Hospital Stay (HOSPITAL_COMMUNITY)
Admission: AD | Admit: 2021-10-01 | Discharge: 2021-10-03 | DRG: 807 | Disposition: A | Discharge status: Home / Self Care | Payer: Medicaid Other | Attending: Obstetrics & Gynecology | Admitting: Obstetrics & Gynecology

## 2021-10-01 ENCOUNTER — Inpatient Hospital Stay (HOSPITAL_COMMUNITY): Payer: Medicaid Other

## 2021-10-01 ENCOUNTER — Inpatient Hospital Stay (HOSPITAL_COMMUNITY): Payer: Medicaid Other | Admitting: Anesthesiology

## 2021-10-01 ENCOUNTER — Other Ambulatory Visit: Payer: Self-pay

## 2021-10-01 ENCOUNTER — Encounter (HOSPITAL_COMMUNITY): Payer: Self-pay | Admitting: Obstetrics & Gynecology

## 2021-10-01 ENCOUNTER — Inpatient Hospital Stay (HOSPITAL_COMMUNITY)
Admission: RE | Admit: 2021-10-01 | Discharge: 2021-10-01 | Disposition: A | Discharge status: Home / Self Care | Payer: Medicaid Other | Source: Ambulatory Visit | Attending: Obstetrics & Gynecology | Admitting: Obstetrics & Gynecology

## 2021-10-01 DIAGNOSIS — O99214 Obesity complicating childbirth: Secondary | ICD-10-CM | POA: Diagnosis not present

## 2021-10-01 DIAGNOSIS — Z3A40 40 weeks gestation of pregnancy: Secondary | ICD-10-CM | POA: Diagnosis not present

## 2021-10-01 DIAGNOSIS — Z6841 Body Mass Index (BMI) 40.0 and over, adult: Principal | ICD-10-CM

## 2021-10-01 DIAGNOSIS — O480 Post-term pregnancy: Principal | ICD-10-CM | POA: Diagnosis present

## 2021-10-01 DIAGNOSIS — O2492 Unspecified diabetes mellitus in childbirth: Secondary | ICD-10-CM | POA: Diagnosis not present

## 2021-10-01 DIAGNOSIS — O164 Unspecified maternal hypertension, complicating childbirth: Secondary | ICD-10-CM | POA: Diagnosis not present

## 2021-10-01 LAB — CBC
HCT: 33.8 % — ABNORMAL LOW (ref 36.0–46.0)
Hemoglobin: 10.7 g/dL — ABNORMAL LOW (ref 12.0–15.0)
MCH: 23.2 pg — ABNORMAL LOW (ref 26.0–34.0)
MCHC: 31.7 g/dL (ref 30.0–36.0)
MCV: 73.3 fL — ABNORMAL LOW (ref 80.0–100.0)
Platelets: 268 10*3/uL (ref 150–400)
RBC: 4.61 MIL/uL (ref 3.87–5.11)
RDW: 15.1 % (ref 11.5–15.5)
WBC: 9.3 10*3/uL (ref 4.0–10.5)
nRBC: 0 % (ref 0.0–0.2)

## 2021-10-01 LAB — COMPREHENSIVE METABOLIC PANEL
ALT: 12 U/L (ref 0–44)
AST: 18 U/L (ref 15–41)
Albumin: 2.5 g/dL — ABNORMAL LOW (ref 3.5–5.0)
Alkaline Phosphatase: 103 U/L (ref 38–126)
Anion gap: 7 (ref 5–15)
BUN: 6 mg/dL (ref 6–20)
CO2: 21 mmol/L — ABNORMAL LOW (ref 22–32)
Calcium: 9.1 mg/dL (ref 8.9–10.3)
Chloride: 106 mmol/L (ref 98–111)
Creatinine, Ser: 0.57 mg/dL (ref 0.44–1.00)
GFR, Estimated: >60 mL/min (ref >60)
Glucose, Bld: 125 mg/dL — ABNORMAL HIGH (ref 70–99)
Potassium: 3.4 mmol/L — ABNORMAL LOW (ref 3.5–5.1)
Sodium: 134 mmol/L — ABNORMAL LOW (ref 135–145)
Total Bilirubin: 0.6 mg/dL (ref 0.3–1.2)
Total Protein: 5.8 g/dL — ABNORMAL LOW (ref 6.5–8.1)

## 2021-10-01 LAB — TYPE AND SCREEN
ABO/RH(D): O NEG
Antibody Screen: POSITIVE

## 2021-10-01 LAB — RPR: RPR Ser Ql: NONREACTIVE

## 2021-10-01 MED ORDER — DIPHENHYDRAMINE HCL 25 MG PO CAPS: 25.0000 mg | ORAL_CAPSULE | Freq: Four times a day (QID) | ORAL | Status: DC | PRN

## 2021-10-01 MED ORDER — OXYTOCIN-SODIUM CHLORIDE 30-0.9 UT/500ML-% IV SOLN: 1.0000 m[IU]/min | INTRAVENOUS | Status: DC

## 2021-10-01 MED ORDER — LIDOCAINE HCL (PF) 1 % IJ SOLN
30.0000 mL | INTRAMUSCULAR | Status: DC | PRN
Start: 2021-10-01 — End: 2021-10-01

## 2021-10-01 MED ORDER — OXYTOCIN-SODIUM CHLORIDE 30-0.9 UT/500ML-% IV SOLN: 2.5000 [IU]/h | INTRAVENOUS | Status: DC | PRN

## 2021-10-01 MED ORDER — OXYTOCIN-SODIUM CHLORIDE 30-0.9 UT/500ML-% IV SOLN
1.0000 m[IU]/min | INTRAVENOUS | Status: DC
  Administered 2021-10-01: 2 m[IU]/min via INTRAVENOUS
  Administered 2021-10-01: 3 m[IU]/min via INTRAVENOUS

## 2021-10-01 MED ORDER — FENTANYL CITRATE (PF) 100 MCG/2ML IJ SOLN: 50.0000 ug | INTRAMUSCULAR | Status: DC | PRN

## 2021-10-01 MED ORDER — PRENATAL MULTIVITAMIN CH
1.0000 | ORAL_TABLET | Freq: Every day | ORAL | Status: DC
  Administered 2021-10-02 – 2021-10-03 (×2): 1 via ORAL
  Filled 2021-10-01 (×2): qty 1

## 2021-10-01 MED ORDER — SOD CITRATE-CITRIC ACID 500-334 MG/5ML PO SOLN: 30.0000 mL | ORAL | Status: DC | PRN

## 2021-10-01 MED ORDER — ONDANSETRON HCL 4 MG/2ML IJ SOLN: 4.0000 mg | Freq: Four times a day (QID) | INTRAMUSCULAR | Status: DC | PRN

## 2021-10-01 MED ORDER — ACETAMINOPHEN 325 MG PO TABS: 650.0000 mg | ORAL_TABLET | ORAL | Status: DC | PRN

## 2021-10-01 MED ORDER — MISOPROSTOL 25 MCG QUARTER TABLET
25.0000 ug | ORAL_TABLET | ORAL | Status: DC
Start: 2021-10-01 — End: 2021-10-01

## 2021-10-01 MED ORDER — ONDANSETRON HCL 4 MG/2ML IJ SOLN: 4.0000 mg | INTRAMUSCULAR | Status: DC | PRN

## 2021-10-01 MED ORDER — MAGNESIUM HYDROXIDE 400 MG/5ML PO SUSP
30.0000 mL | ORAL | Status: DC | PRN
Start: 2021-10-01 — End: 2021-10-03

## 2021-10-01 MED ORDER — WITCH HAZEL-GLYCERIN EX PADS: 1.0000 "application " | MEDICATED_PAD | CUTANEOUS | Status: DC | PRN

## 2021-10-01 MED ORDER — TERBUTALINE SULFATE 1 MG/ML IJ SOLN: 0.2500 mg | Freq: Once | INTRAMUSCULAR | Status: DC | PRN

## 2021-10-01 MED ORDER — ZOLPIDEM TARTRATE 5 MG PO TABS: 5.0000 mg | ORAL_TABLET | Freq: Every evening | ORAL | Status: DC | PRN

## 2021-10-01 MED ORDER — PHENYLEPHRINE 80 MCG/ML (10ML) SYRINGE FOR IV PUSH (FOR BLOOD PRESSURE SUPPORT): 80.0000 ug | PREFILLED_SYRINGE | INTRAVENOUS | Status: DC | PRN

## 2021-10-01 MED ORDER — OXYTOCIN BOLUS FROM INFUSION: 333.0000 mL | Freq: Once | INTRAVENOUS | Status: DC

## 2021-10-01 MED ORDER — METHYLERGONOVINE MALEATE 0.2 MG PO TABS
0.2000 mg | ORAL_TABLET | ORAL | Status: DC
  Filled 2021-10-01: qty 1

## 2021-10-01 MED ORDER — OXYCODONE HCL 5 MG PO TABS: 5.0000 mg | ORAL_TABLET | ORAL | Status: DC | PRN

## 2021-10-01 MED ORDER — OXYTOCIN-SODIUM CHLORIDE 30-0.9 UT/500ML-% IV SOLN
2.5000 [IU]/h | INTRAVENOUS | Status: DC
  Filled 2021-10-01: qty 500

## 2021-10-01 MED ORDER — MISOPROSTOL 200 MCG PO TABS
800.0000 ug | ORAL_TABLET | Freq: Once | ORAL | Status: AC
Start: 2021-10-01 — End: 2021-10-01
  Administered 2021-10-01: 800 ug via RECTAL
  Filled 2021-10-01: qty 4

## 2021-10-01 MED ORDER — LACTATED RINGERS IV SOLN: INTRAVENOUS | Status: DC

## 2021-10-01 MED ORDER — LACTATED RINGERS IV SOLN: 500.0000 mL | INTRAVENOUS | Status: DC | PRN

## 2021-10-01 MED ORDER — SIMETHICONE 80 MG PO CHEW: 80.0000 mg | CHEWABLE_TABLET | ORAL | Status: DC | PRN

## 2021-10-01 MED ORDER — LIDOCAINE HCL (PF) 1 % IJ SOLN
INTRAMUSCULAR | Status: DC | PRN
  Administered 2021-10-01: 11 mL via EPIDURAL

## 2021-10-01 MED ORDER — IBUPROFEN 600 MG PO TABS
600.0000 mg | ORAL_TABLET | Freq: Four times a day (QID) | ORAL | Status: DC
  Administered 2021-10-01 – 2021-10-03 (×7): 600 mg via ORAL
  Filled 2021-10-01 (×7): qty 1

## 2021-10-01 MED ORDER — ONDANSETRON HCL 4 MG PO TABS: 4.0000 mg | ORAL_TABLET | ORAL | Status: DC | PRN

## 2021-10-01 MED ORDER — BENZOCAINE-MENTHOL 20-0.5 % EX AERO: 1.0000 "application " | INHALATION_SPRAY | CUTANEOUS | Status: DC | PRN

## 2021-10-01 MED ORDER — EPHEDRINE 5 MG/ML INJ: 10.0000 mg | INTRAVENOUS | Status: DC | PRN

## 2021-10-01 MED ORDER — DIBUCAINE (PERIANAL) 1 % EX OINT: 1.0000 "application " | TOPICAL_OINTMENT | CUTANEOUS | Status: DC | PRN

## 2021-10-01 MED ORDER — COCONUT OIL OIL: 1.0000 "application " | TOPICAL_OIL | Status: DC | PRN

## 2021-10-01 MED ORDER — OXYCODONE HCL 5 MG PO TABS: 10.0000 mg | ORAL_TABLET | ORAL | Status: DC | PRN

## 2021-10-01 MED ORDER — OXYTOCIN-SODIUM CHLORIDE 30-0.9 UT/500ML-% IV SOLN
1.0000 m[IU]/min | INTRAVENOUS | Status: DC
  Administered 2021-10-01: 1 m[IU]/min via INTRAVENOUS
  Filled 2021-10-01: qty 500

## 2021-10-01 MED ORDER — FENTANYL-BUPIVACAINE-NACL 0.5-0.125-0.9 MG/250ML-% EP SOLN
12.0000 mL/h | EPIDURAL | Status: DC | PRN
  Administered 2021-10-01: 12 mL/h via EPIDURAL
  Filled 2021-10-01: qty 250

## 2021-10-01 MED ORDER — LACTATED RINGERS IV SOLN: 500.0000 mL | Freq: Once | INTRAVENOUS | Status: DC

## 2021-10-01 MED ORDER — DIPHENHYDRAMINE HCL 50 MG/ML IJ SOLN: 12.5000 mg | INTRAMUSCULAR | Status: DC | PRN

## 2021-10-01 NOTE — Lactation Note (Signed)
This note was copied from a baby's chart. Lactation Consultation Note  Patient Name: Boy Kerensa Nicklas [id number] Date: 10/01/2021   Age:28 hours  LC checked in with RN, Marc Morgans, Mom declined [facility] services on the floor.   Maternal Data    Feeding    LATCH Score Latch: Repeated attempts needed to sustain latch, nipple held in mouth throughout feeding, stimulation needed to elicit sucking reflex.  Audible Swallowing: None  Type of Nipple: Everted at rest and after stimulation  Comfort (Breast/Nipple): Soft / non-tender  Hold (Positioning): Assistance needed to correctly position infant at breast and maintain latch.  LATCH Score: 6   Lactation Tools Discussed/Used    Interventions    Discharge    Consult Status      Danie Diehl  Nicholson-Springer 10/01/2021, 8:53 PM

## 2021-10-01 NOTE — Progress Notes (Addendum)
Dana Solomon is a 26 y.o. [id number] at [redacted]w[redacted]d admitted for induction of labor for high BMI.    Subjective: Patient feels strong contractions, declines pain medication.   Objective: BP (!) 121/59   Pulse 100   Temp 98.4 F (36.9 C) (Oral)   Resp 17   Ht 5' 4.5" (1.638 m)   Wt 123.7 kg   LMP 11/26/2020   BMI 46.09 kg/m  I/O last 3 completed shifts: In: 362.1 [P.O.:60; I.V.:302.1] Out: -  No intake/output data recorded.  FHT:  FHR: 130 bpm, variability: moderate,  accelerations:  Present,  decelerations:  Present Variable UC:   irregular, every 1 to 3 minutes SVE:   Dilation: 5 Effacement (%): 70 Station: -2 Exam by:: Dana Browns, MD.  AROM performed at 11.10 am: small clear fluid.   Labs: Lab Results  Component Value Date   WBC 9.3 10/01/2021   HGB 10.7 (L) 10/01/2021   HCT 33.8 (L) 10/01/2021   MCV 73.3 (L) 10/01/2021   PLT 268 10/01/2021    Assessment / Plan: Induction of labor due to [facility] medical conditions and high BMI,  progressing well on pitocin  Labor: Progressing normally Preeclampsia:   None Fetal Wellbeing:  Category II Pain Control:   None as per patient's choice.  I/D:   GBS negative.  Anticipated MOD:  NSVD.   Patient does not desire postpartum tubal ligation any more.  She will use other natural methods for birth control.    Dana Sours, MD.  10/01/2021, 1:25 PM

## 2021-10-01 NOTE — Progress Notes (Addendum)
Post Partum Day 0    Called to evaluate patient for passage of clots and bleeding after delivery.  She had 427cc clots on fundal rub at labor and delivery and pitocin was restarted then. Then had 100 cc blood after pitocin was temporarily stopped. As per RN bleeding has subsided and patient is about to receive her oral methergine.  Objective: Blood pressure 131/69, pulse 79, temperature 97.7 F (36.5 C), temperature source Oral, resp. rate 18, height 5' 4.5" (1.638 m), weight 123.7 kg, last menstrual period 11/26/2020, unknown if currently breastfeeding.  Physical Exam:  General: alert, cooperative, and no distress Lochia: appropriate Uterine Fundus: firm/  60 cc clots expressed from lower uterine segment with sterile exam. No active bleeding.  Incision: N/A DVT Evaluation: No evidence of DVT seen on physical exam.  Recent Labs    10/01/21 0035  HGB 10.7*  HCT 33.8*    Assessment/Plan: PPD # 0 after a vaginal delivery and with total blood loss from delivery and after delivery at total 687 cc, likely due to postpartum atony and clots in lower uterine segment, - C/w pitocin and methergine series.  - Patient encouraged to hydrate and empty her bladder. - F/u CBC in AM.    LOS: 0 days   [name], MD.  10/01/2021, 9:24 PM   Addendum: RN called to notify me of patient with elevated blood pressures, will d/c methergine.  Will order CMP and urine PCR. Rectal cytotec ordered. Will follow BPs closely.    10/01/2021    7:51 PM 10/01/2021    7:17 PM 10/01/2021    7:02 PM  Vitals with BMI  Systolic 131 156 161  Diastolic 69 86 71  Pulse 79 114 82    Dr. Sallye Ober. 10/01/2021.  9: 38 PM.

## 2021-10-01 NOTE — Anesthesia Preprocedure Evaluation (Signed)
Anesthesia Evaluation  Patient identified by MRN, date of birth, ID band Patient awake    Reviewed: Allergy & Precautions, NPO status , Patient's Chart, lab work & pertinent test results  Airway Mallampati: III  TM Distance: >3 FB Neck ROM: Full    Dental no notable dental hx. (+) Teeth Intact, Dental Advisory Given   Pulmonary neg pulmonary ROS,    Pulmonary exam normal breath sounds clear to auscultation       Cardiovascular hypertension, Pt. on medications Normal cardiovascular exam Rhythm:Regular Rate:Normal  On Mg++   Neuro/Psych negative neurological ROS  negative psych ROS   GI/Hepatic negative GI ROS, Neg liver ROS,   Endo/Other  diabetesMorbid obesity  Renal/GU      Musculoskeletal   Abdominal (+) + obese,   Peds  Hematology Hgb 10.8 Plt 292   Anesthesia Other Findings   Reproductive/Obstetrics (+) Pregnancy                             Anesthesia Physical  Anesthesia Plan  ASA: III  Anesthesia Plan: Epidural   Post-op Pain Management:    Induction:   PONV Risk Score and Plan:   Airway Management Planned:   Additional Equipment:   Intra-op Plan:   Post-operative Plan:   Informed Consent: I have reviewed the patients History and Physical, chart, labs and discussed the procedure including the risks, benefits and alternatives for the proposed anesthesia with the patient or authorized representative who has indicated his/her understanding and acceptance.       Plan Discussed with:   Anesthesia Plan Comments: (39.3 Wk G2P1 with Pre E on Mg, BMI (50) for LEA)        Anesthesia Quick Evaluation

## 2021-10-01 NOTE — Lactation Note (Signed)
This note was copied from a baby's chart. Lactation Consultation Note  Patient Name: Dana Solomon Date: 10/01/2021 Reason for consult: Initial assessment;Mother's request;Difficult latch;Term;Breastfeeding assistance;RN request Age:28 hours Mom called for latch assistance. During visit, Mom shaking stating combination of coldness and adrenaline. LC gave Mom warm blankets. Mom stated had some bleeding earlier. LC called RN to evaluate, Tennis Must reassured  Mom.  Mom able to breastfeed for 10 mins with more depth. In midst of latch, Mom complaining of uterine cramping and began shaking again. RN, Marc Morgans alerted Mom asking for medication for intensity of pain feels while infant latching.   Mom aware to feed by cues 8-12x 24hr period. Mom to breast compress to offer more volume and latch in cross cradle prone to get more depth with the latch.   All questions answered at the end of the visit.  Mom does not have pump at home.   Maternal Data Does the patient have breastfeeding experience prior to this delivery?: Yes How long did the patient breastfeed?: 3 years and 3 months ( last child is year old)  Feeding Mother's Current Feeding Choice: Breast Milk  LATCH Score Latch: Repeated attempts needed to sustain latch, nipple held in mouth throughout feeding, stimulation needed to elicit sucking reflex.  Audible Swallowing: Spontaneous and intermittent  Type of Nipple: Everted at rest and after stimulation  Comfort (Breast/Nipple): Soft / non-tender  Hold (Positioning): Assistance needed to correctly position infant at breast and maintain latch.  LATCH Score: 8   Lactation Tools Discussed/Used    Interventions Interventions: Breast feeding basics reviewed;Assisted with latch;Skin to skin;Breast massage;Breast compression;Adjust position;Support pillows;Position options;Education;Scientist, research (physical sciences)  Discharge WIC Program:  Yes  Consult Status Consult Status: Follow-up Date: 10/02/21 Follow-up type: In-patient    Dana Ronda  Solomon 10/01/2021, 11:00 PM

## 2021-10-01 NOTE — Anesthesia Procedure Notes (Signed)
Epidural Patient location during procedure: OB Start time: 10/01/2021 2:40 PM End time: 10/01/2021 2:57 PM  Staffing Anesthesiologist: Lowella Curb, MD Performed: anesthesiologist   Preanesthetic Checklist Completed: patient identified, IV checked, site marked, risks and benefits discussed, surgical consent, monitors and equipment checked, pre-op evaluation and timeout performed  Epidural Patient position: sitting Prep: ChloraPrep Patient monitoring: heart rate, cardiac monitor, continuous pulse ox and blood pressure Approach: midline Location: L2-L3 Injection technique: LOR saline  Needle:  Needle type: Tuohy  Needle gauge: 17 G Needle length: 9 cm Needle insertion depth: 8 cm Catheter type: closed end flexible Catheter size: 20 Guage Catheter at skin depth: 13 cm Test dose: negative  Assessment Events: blood not aspirated, injection not painful, no injection resistance, no paresthesia and negative IV test  Additional Notes Reason for block:procedure for pain

## 2021-10-02 LAB — CBC
HCT: 26.7 % — ABNORMAL LOW (ref 36.0–46.0)
Hemoglobin: 8.6 g/dL — ABNORMAL LOW (ref 12.0–15.0)
MCH: 23.7 pg — ABNORMAL LOW (ref 26.0–34.0)
MCHC: 32.2 g/dL (ref 30.0–36.0)
MCV: 73.6 fL — ABNORMAL LOW (ref 80.0–100.0)
Platelets: 236 10*3/uL (ref 150–400)
RBC: 3.63 MIL/uL — ABNORMAL LOW (ref 3.87–5.11)
RDW: 15.1 % (ref 11.5–15.5)
WBC: 14.7 10*3/uL — ABNORMAL HIGH (ref 4.0–10.5)
nRBC: 0 % (ref 0.0–0.2)

## 2021-10-02 MED ORDER — POLYSACCHARIDE IRON COMPLEX 150 MG PO CAPS: 150.0000 mg | ORAL_CAPSULE | Freq: Every day | ORAL | 1 refills | Status: DC

## 2021-10-02 MED ORDER — IBUPROFEN 600 MG PO TABS: 600.0000 mg | ORAL_TABLET | Freq: Four times a day (QID) | ORAL | 0 refills | Status: DC

## 2021-10-02 MED ORDER — RHO D IMMUNE GLOBULIN 1500 UNIT/2ML IJ SOSY
300.0000 ug | PREFILLED_SYRINGE | Freq: Once | INTRAMUSCULAR | Status: AC
Start: 2021-10-02 — End: 2021-10-02
  Administered 2021-10-02: 300 ug via INTRAVENOUS
  Filled 2021-10-02: qty 2

## 2021-10-02 NOTE — Lactation Note (Signed)
This note was copied from a baby's chart. Lactation Consultation Note  Patient Name: Dana Solomon [id number] Date: 10/02/2021 Reason for consult: Follow-up assessment;Term Age:28 hours  LC in to room prior to discharge. Parents report good feedings and deny any pain/discomfort. Discussed normal behavior and patterns after 24h, voids and stools as signs good intake, pumping, clusterfeeding, skin to skin. Talked about milk coming into volume and managing engorgement.  Provided a manual pump.  Plan: 1-Aim for a deep, comfortable latch, breastfeeding on demand or 8-12 times in 24h period. 2-Hand express/pump as needed for supplementation 3-Encouraged maternal rest, hydration and food intake.   Contact LC as needed for feeds/support/concerns/questions. All questions answered at this time. Reviewed [facility] brochure.     Maternal Data Has patient been taught Hand Expression?: Yes Does the patient have breastfeeding experience prior to this delivery?: Yes How long did the patient breastfeed?: >12 months first child, 6 months second child  Feeding Mother's Current Feeding Choice: Breast Milk  LATCH Score Latch: Grasps breast easily, tongue down, lips flanged, rhythmical sucking.  Audible Swallowing: Spontaneous and intermittent  Type of Nipple: Everted at rest and after stimulation  Comfort (Breast/Nipple): Soft / non-tender  Hold (Positioning): Assistance needed to correctly position infant at breast and maintain latch.  LATCH Score: 9   Lactation Tools Discussed/Used Tools: Pump;Flanges Flange Size: 27 Breast pump type: Manual Pump Education: Setup, frequency, and cleaning;Milk Storage Reason for Pumping: mother's request Pumping frequency: as needed  Interventions Interventions: Breast feeding basics reviewed;Assisted with latch;Skin to skin;Breast massage;Hand express;Breast compression;Hand pump;Expressed milk;Adjust position;Education;LC Services  brochure  Discharge Discharge Education: Engorgement and breast care;Warning signs for feeding baby Pump: Manual WIC Program: Yes  Consult Status Consult Status: Complete Date: 10/02/21 Follow-up type: Call as needed    [place] 10/02/2021, 11:26 AM

## 2021-10-02 NOTE — Anesthesia Postprocedure Evaluation (Signed)
Anesthesia Post Note  Patient: Dana Solomon  Procedure(s) Performed: AN AD HOC LABOR EPIDURAL     Patient location during evaluation: Mother Baby Anesthesia Type: Epidural Level of consciousness: awake and alert Pain management: pain level controlled Vital Signs Assessment: post-procedure vital signs reviewed and stable Respiratory status: spontaneous breathing, nonlabored ventilation and respiratory function stable Cardiovascular status: stable Postop Assessment: no headache, no backache, epidural receding, no apparent nausea or vomiting, patient able to bend at knees, adequate PO intake and able to ambulate Anesthetic complications: no   No notable events documented.  Last Vitals:  Vitals:   10/02/21 0438 10/02/21 0824  BP: 118/66 132/70  Pulse: 89 84  Resp: 18 18  Temp: 37.2 C 36.8 C  SpO2: 100% 100%    Last Pain:  Vitals:   10/02/21 0824  TempSrc: Oral  PainSc: 2    Pain Goal:                   Jabier Mutton

## 2021-10-02 NOTE — Discharge Summary (Signed)
Postpartum Discharge Summary    Patient Name: Dana Solomon DOB: 01-25-1996 MRN: [id number]  Date of admission: 10/01/2021 Delivery date:10/01/2021  Delivering provider: Waymon Amato  Date of discharge: 10/02/2021  Admitting diagnosis: Induction of labor due to Body mass index (BMI) 46 Vaginal delivery [O80] Anemia Intrauterine pregnancy: [redacted]w[redacted]d     Secondary diagnosis:  Induction of labor due to Body mass index (BMI) 46 Vaginal delivery [O80] Anemia    Discharge diagnosis:  Term Pregnancy Delivered      Anemia     Gestational Hypertension                                     Post partum procedures:rhogam Augmentation: AROM and Pitocin Complications: None  Hospital course: Induction of Labor With Vaginal Delivery   26 y.o. yo [phone] at [redacted]w[redacted]d was admitted to the hospital 10/01/2021 for induction of labor.  Indication for induction:  HIgh BMI .  Patient had an uncomplicated labor course as follows: Membrane Rupture Time/Date: 11:10 AM ,10/01/2021   Delivery Method:Vaginal, Spontaneous  Episiotomy: None  Lacerations:  None  Details of delivery can be found in separate delivery note.  Patient had a routine postpartum course. Patient is discharged home 10/02/21.  Total EBL was about 727 cc and patient received IV pitocin and rectal cytotec to manage the bleeding. She denied any symptoms of anemia.  She desires oral iron use for anemia management.  She had some elevated blood pressure in mild range, negative preeclampsia labs and blood pressures normalized spontaneously.   Newborn Data: Birth date:10/01/2021  Birth time:5:44 PM  Gender:Female  Living status:Living  Apgars:8 ,9  Weight:4000 g    Physical exam  Vitals:   10/01/21 2300 10/02/21 0000 10/02/21 0438 10/02/21 0824  BP: 112/72 112/72 118/66 132/70  Pulse: 85 85 89 84  Resp: 18 18 18 18   Temp: 98.2 F (36.8 C) 98.2 F (36.8 C) 99 F (37.2 C) 98.2 F (36.8 C)  TempSrc: Oral Oral Oral Oral  SpO2: 100% 100% 100%  100%  Weight:      Height:       General: alert, cooperative, and no distress Lochia: appropriate Uterine Fundus: firm Incision: N/A DVT Evaluation: No evidence of DVT seen on physical exam. Calf/Ankle edema is present Labs: Lab Results  Component Value Date   WBC 14.7 (H) 10/02/2021   HGB 8.6 (L) 10/02/2021   HCT 26.7 (L) 10/02/2021   MCV 73.6 (L) 10/02/2021   PLT 236 10/02/2021      Latest Ref Rng & Units 10/01/2021   10:04 PM  CMP  Glucose 70 - 99 mg/dL 125   BUN 6 - 20 mg/dL 6   Creatinine 0.44 - 1.00 mg/dL 0.57   Sodium 135 - 145 mmol/L 134   Potassium 3.5 - 5.1 mmol/L 3.4   Chloride 98 - 111 mmol/L 106   CO2 22 - 32 mmol/L 21   Calcium 8.9 - 10.3 mg/dL 9.1   Total Protein 6.5 - 8.1 g/dL 5.8   Total Bilirubin 0.3 - 1.2 mg/dL 0.6   Alkaline Phos 38 - 126 U/L 103   AST 15 - 41 U/L 18   ALT 0 - 44 U/L 12    Edinburgh Score:    10/02/2021    8:10 AM  Edinburgh Postnatal Depression Scale Screening Tool  I have been able to laugh and see the funny side of things.  0  I have looked forward with enjoyment to things. 0  I have blamed myself unnecessarily when things went wrong. 1  I have been anxious or worried for no good reason. 2  I have felt scared or panicky for no good reason. 0  Things have been getting on top of me. 0  I have been so unhappy that I have had difficulty sleeping. 0  I have felt sad or miserable. 0  I have been so unhappy that I have been crying. 1  The thought of harming myself has occurred to me. 0  Edinburgh Postnatal Depression Scale Total 4     After visit meds:  Allergies as of 10/02/2021   No Known Allergies      Medication List     STOP taking these medications    benzonatate 100 MG capsule Commonly known as: TESSALON   Mag-Oxide 200 MG Tabs Generic drug: Magnesium Oxide   ondansetron 4 MG disintegrating tablet Commonly known as: Zofran ODT       TAKE these medications    ibuprofen 600 MG tablet Commonly known as:  ADVIL Take 1 tablet (600 mg total) by mouth every 6 (six) hours.   iron polysaccharides 150 MG capsule Commonly known as: NIFEREX Take 1 capsule (150 mg total) by mouth daily.   prenatal multivitamin Tabs tablet Take 1 tablet by mouth daily at 12 noon.         Discharge home in stable condition Infant Feeding: Breast Infant Disposition:home with mother Discharge instruction: per After Visit Summary and Postpartum booklet. Activity: Advance as tolerated. Pelvic rest for 6 weeks.  Diet: iron rich diet Future Appointments:No future appointments. Follow up Visit:  [facility]. Schedule an appointment as soon as possible for a visit in 1 week(s).   Specialty: Obstetrics and Gynecology Why: Mood and blood pressure check. Contact information: [address]. [address] [phone]        [facility]. Go in 6 week(s).   Why: Postpartum check Contact information: [address] [place] [phone]                Anticipated Birth Control:  Natural methods, declines postpartum bilateral tubal ligation.  10/02/2021 Archie Endo, MD

## 2021-10-03 LAB — RH IG WORKUP (INCLUDES ABO/RH)
Fetal Screen: NEGATIVE
Gestational Age(Wks): 40.2
Unit division: 0

## 2021-10-03 NOTE — Progress Notes (Addendum)
Pt states she is planning on using neem oil for birth control. Pt understands the reason for pelvic rest for 6 weeks. MD recommended not using; RN shared with Pt.  Pt stated she took for 2 years after her last child and was sexually active for 4y with no pregnancy. She was able to get pregnant after 7y.

## 2021-10-03 NOTE — Lactation Note (Signed)
This note was copied from a baby's chart. Lactation Consultation Note  Patient Name: Dana Solomon [id number] Date: 10/03/2021 Reason for consult: Follow-up assessment Age:28 hours  P3, Ex BF.  Pacifier use not recommended at this time.  Recommend feeding on demand with cues 8-12 times per day. Reviewed engorgement care and monitoring voids/stools. Discussed pumping in addition to breastfeeding to help stabilize weight loss.   Feeding Mother's Current Feeding Choice: Breast Milk   Interventions Interventions: Education  Discharge Discharge Education: Engorgement and breast care;Warning signs for feeding baby  Consult Status Consult Status: Complete Date: 10/03/21    [name] [facility] 10/03/2021, 10:30 AM

## 2021-10-03 NOTE — Progress Notes (Signed)
Pt doing well.  She stayed until today because the baby wasn't being discharged d/t jaundice.  She is ready for discharge and denies any sxs.  Please see discharge from yesterday by Dr. Sallye Ober.

## 2021-10-10 ENCOUNTER — Telehealth (HOSPITAL_COMMUNITY): Payer: Self-pay

## 2021-10-10 NOTE — Telephone Encounter (Signed)
"  I'm having some soreness with latching." RN reviewed LC resources with patient. "I'm still bleeding but I assume that's normal. I have been having some pelvic pain." RN reviewed normal lochia amounts, color, and duration of bleeding. RN also reviewed uterine involution. Patient declines any other questions or concerns about her healing.   "He has had 2 doctor visits, we go back on Wednesday. He is doing good. He sleeps in a bedside bassinet." RN reviewed ABC's of safe sleep with patient. Patient declines any questions or concerns about baby.  EPDS score is 6.  Dana Solomon [facility] 06/19//2023,1731

## 2021-10-22 DIAGNOSIS — Z419 Encounter for procedure for purposes other than remedying health state, unspecified: Secondary | ICD-10-CM | POA: Diagnosis not present

## 2021-11-03 DIAGNOSIS — Z3040 Encounter for surveillance of contraceptives, unspecified: Secondary | ICD-10-CM | POA: Diagnosis not present

## 2021-11-22 DIAGNOSIS — Z419 Encounter for procedure for purposes other than remedying health state, unspecified: Secondary | ICD-10-CM | POA: Diagnosis not present

## 2021-11-24 ENCOUNTER — Other Ambulatory Visit: Payer: Self-pay

## 2021-11-24 ENCOUNTER — Encounter (HOSPITAL_COMMUNITY): Payer: Self-pay

## 2021-11-24 ENCOUNTER — Emergency Department (HOSPITAL_COMMUNITY)
Admission: EM | Admit: 2021-11-24 | Discharge: 2021-11-24 | Disposition: A | Discharge status: Home / Self Care | Payer: Medicaid Other | Attending: Emergency Medicine | Admitting: Emergency Medicine

## 2021-11-24 DIAGNOSIS — N3001 Acute cystitis with hematuria: Secondary | ICD-10-CM | POA: Diagnosis not present

## 2021-11-24 DIAGNOSIS — R1030 Lower abdominal pain, unspecified: Secondary | ICD-10-CM | POA: Diagnosis present

## 2021-11-24 DIAGNOSIS — I10 Essential (primary) hypertension: Secondary | ICD-10-CM | POA: Insufficient documentation

## 2021-11-24 LAB — BASIC METABOLIC PANEL
Anion gap: 8 (ref 5–15)
BUN: 14 mg/dL (ref 6–20)
CO2: 25 mmol/L (ref 22–32)
Calcium: 9.4 mg/dL (ref 8.9–10.3)
Chloride: 104 mmol/L (ref 98–111)
Creatinine, Ser: 0.79 mg/dL (ref 0.44–1.00)
GFR, Estimated: >60 mL/min (ref >60)
Glucose, Bld: 96 mg/dL (ref 70–99)
Potassium: 3.8 mmol/L (ref 3.5–5.1)
Sodium: 137 mmol/L (ref 135–145)

## 2021-11-24 LAB — URINALYSIS, ROUTINE W REFLEX MICROSCOPIC
Bilirubin Urine: NEGATIVE
Glucose, UA: NEGATIVE mg/dL
Ketones, ur: NEGATIVE mg/dL
Nitrite: NEGATIVE
Protein, ur: 100 mg/dL — AB
RBC / HPF: >50 RBC/hpf — ABNORMAL HIGH (ref 0–5)
Specific Gravity, Urine: 1.016 (ref 1.005–1.030)
WBC, UA: >50 WBC/hpf — ABNORMAL HIGH (ref 0–5)
pH: 6 (ref 5.0–8.0)

## 2021-11-24 LAB — CBC WITH DIFFERENTIAL/PLATELET
Abs Immature Granulocytes: 0.01 10*3/uL (ref 0.00–0.07)
Basophils Absolute: 0.1 10*3/uL (ref 0.0–0.1)
Basophils Relative: 1 %
Eosinophils Absolute: 0.1 10*3/uL (ref 0.0–0.5)
Eosinophils Relative: 2 %
HCT: 34.9 % — ABNORMAL LOW (ref 36.0–46.0)
Hemoglobin: 10.8 g/dL — ABNORMAL LOW (ref 12.0–15.0)
Immature Granulocytes: 0 %
Lymphocytes Relative: 31 %
Lymphs Abs: 1.9 10*3/uL (ref 0.7–4.0)
MCH: 22.1 pg — ABNORMAL LOW (ref 26.0–34.0)
MCHC: 30.9 g/dL (ref 30.0–36.0)
MCV: 71.4 fL — ABNORMAL LOW (ref 80.0–100.0)
Monocytes Absolute: 0.9 10*3/uL (ref 0.1–1.0)
Monocytes Relative: 14 %
Neutro Abs: 3.2 10*3/uL (ref 1.7–7.7)
Neutrophils Relative %: 52 %
Platelets: 359 10*3/uL (ref 150–400)
RBC: 4.89 MIL/uL (ref 3.87–5.11)
RDW: 15.7 % — ABNORMAL HIGH (ref 11.5–15.5)
WBC: 6.2 10*3/uL (ref 4.0–10.5)
nRBC: 0 % (ref 0.0–0.2)

## 2021-11-24 LAB — PREGNANCY, URINE: Preg Test, Ur: NEGATIVE

## 2021-11-24 MED ORDER — CEPHALEXIN 500 MG PO CAPS: 500.0000 mg | ORAL_CAPSULE | Freq: Four times a day (QID) | ORAL | 0 refills | Status: DC

## 2021-11-24 MED ORDER — PHENAZOPYRIDINE HCL 100 MG PO TABS
200.0000 mg | ORAL_TABLET | Freq: Once | ORAL | Status: AC
  Administered 2021-11-24: 200 mg via ORAL
  Filled 2021-11-24: qty 2

## 2021-11-24 MED ORDER — PHENAZOPYRIDINE HCL 200 MG PO TABS: 200.0000 mg | ORAL_TABLET | Freq: Three times a day (TID) | ORAL | 0 refills | Status: DC

## 2021-11-24 MED ORDER — CEPHALEXIN 250 MG PO CAPS
500.0000 mg | ORAL_CAPSULE | Freq: Once | ORAL | Status: AC
Start: 2021-11-24 — End: 2021-11-24
  Administered 2021-11-24: 500 mg via ORAL
  Filled 2021-11-24: qty 2

## 2021-11-24 NOTE — ED Notes (Signed)
PATIENT IS ON GROUND FLOOR GETTING HER CHILDERN SOME FOOD HAS NOT RETURN YET

## 2021-11-24 NOTE — ED Triage Notes (Signed)
C/o suprapubic pain, dysuria and cloudy urine x 4 days. Recently gave birth 1 month ago.

## 2021-11-24 NOTE — ED Provider Notes (Signed)
[facility] EMERGENCY DEPARTMENT Provider Note   CSN: [id number] Arrival date & time: 11/24/21  1287     History  Chief Complaint  Patient presents with   Dysuria    Dana Solomon is a 26 y.o. female.  Pt is a 26 yo female with htn and a recent delivery (6/10).  Pt said she has had some suprapubic pain for 2 days.  She feels like she may have a UTI.  She is breast feeding.  No f/c.       Home Medications Prior to Admission medications   Medication Sig Start Date End Date Taking? Authorizing Provider  cephALEXin (KEFLEX) 500 MG capsule Take 1 capsule (500 mg total) by mouth 4 (four) times daily. 11/24/21  Yes Jacalyn Lefevre, MD  phenazopyridine (PYRIDIUM) 200 MG tablet Take 1 tablet (200 mg total) by mouth 3 (three) times daily. 11/24/21  Yes Jacalyn Lefevre, MD  ibuprofen (ADVIL) 600 MG tablet Take 1 tablet (600 mg total) by mouth every 6 (six) hours. 10/02/21   Hoover Browns, MD  iron polysaccharides (NIFEREX) 150 MG capsule Take 1 capsule (150 mg total) by mouth daily. 10/02/21   Hoover Browns, MD  Prenatal Vit-Fe Fumarate-FA (PRENATAL MULTIVITAMIN) TABS tablet Take 1 tablet by mouth daily at 12 noon.    [provider]      Allergies    Patient has no known allergies.    Review of Systems   Review of Systems  Gastrointestinal:  Positive for abdominal pain.  Genitourinary:  Positive for dysuria.  All other systems reviewed and are negative.   Physical Exam Updated Vital Signs BP 122/66 (BP Location: Right Arm)   Pulse 92   Temp 98.1 F (36.7 C) (Oral)   Resp 17   Ht 5\' 4"  (1.626 m)   Wt 122.5 kg   LMP 11/26/2020   SpO2 97%   BMI 46.35 kg/m  Physical Exam Vitals and nursing note reviewed.  Constitutional:      Appearance: Normal appearance.  HENT:     Head: Normocephalic and atraumatic.     Right Ear: External ear normal.     Left Ear: External ear normal.     Mouth/Throat:     Mouth: Mucous membranes are dry.  Eyes:      Conjunctiva/sclera: Conjunctivae normal.     Pupils: Pupils are equal, round, and reactive to light.  Cardiovascular:     Rate and Rhythm: Normal rate and regular rhythm.     Pulses: Normal pulses.     Heart sounds: Normal heart sounds.  Pulmonary:     Effort: Pulmonary effort is normal.     Breath sounds: Normal breath sounds.  Abdominal:     General: Abdomen is flat. Bowel sounds are normal.     Palpations: Abdomen is soft.     Tenderness: There is abdominal tenderness in the suprapubic area.  Musculoskeletal:        General: Normal range of motion.     Cervical back: Normal range of motion and neck supple.  Skin:    General: Skin is warm.     Capillary Refill: Capillary refill takes less than 2 seconds.  Neurological:     General: No focal deficit present.     Mental Status: She is alert and oriented to person, place, and time.  Psychiatric:        Mood and Affect: Mood normal.        Behavior: Behavior normal.     ED  Results / Procedures / Treatments   Labs (all labs ordered are listed, but only abnormal results are displayed) Labs Reviewed  URINALYSIS, ROUTINE W REFLEX MICROSCOPIC - Abnormal; Notable for the following components:      Result Value   Color, Urine AMBER (*)    APPearance TURBID (*)    Hgb urine dipstick MODERATE (*)    Protein, ur 100 (*)    Leukocytes,Ua LARGE (*)    RBC / HPF >50 (*)    WBC, UA >50 (*)    Bacteria, UA FEW (*)    All other components within normal limits  CBC WITH DIFFERENTIAL/PLATELET - Abnormal; Notable for the following components:   Hemoglobin 10.8 (*)    HCT 34.9 (*)    MCV 71.4 (*)    MCH 22.1 (*)    RDW 15.7 (*)    All other components within normal limits  BASIC METABOLIC PANEL  PREGNANCY, URINE    EKG None  Radiology No results found.  Procedures Procedures    Medications Ordered in ED Medications  cephALEXin (KEFLEX) capsule 500 mg (has no administration in time range)  phenazopyridine (PYRIDIUM) tablet  200 mg (has no administration in time range)    ED Course/ Medical Decision Making/ A&P                           Medical Decision Making Risk Prescription drug management.   Pt does have a UTI.  She is given a dose of keflex and pyridium in the ED.  She is stable for d/c.  She knows to return if worse.        Final Clinical Impression(s) / ED Diagnoses Final diagnoses:  Acute cystitis with hematuria    Rx / DC Orders ED Discharge Orders          Ordered    cephALEXin (KEFLEX) 500 MG capsule  4 times daily        11/24/21 1453    phenazopyridine (PYRIDIUM) 200 MG tablet  3 times daily        11/24/21 1453              Jacalyn Lefevre, MD 11/24/21 1459

## 2021-11-24 NOTE — ED Provider Triage Note (Signed)
  Emergency Medicine Provider Triage Evaluation Note  MRN:  [id number]  Arrival date & time: 11/24/21    Medically screening exam initiated at 3:41 AM.   CC:   Dysuria   HPI:  Dana Solomon is a 26 y.o. year-old female presents to the ED with chief complaint of urinary urgency, frequency, and dysuria.  Onset 2 days ago.  Reports associated abdominal pains.  Denies fever or vomiting.  History provided by patient. ROS:  -As included in HPI PE:   Vitals:   11/24/21 0338  BP: 110/70  Pulse: (!) 132  Resp: 18  Temp: 98.5 F (36.9 C)  SpO2: 99%    Non-toxic appearing No respiratory distress  MDM:  Based on signs and symptoms, UTI is highest on my differential, followed by pyelo. I've ordered labs in triage to expedite lab/diagnostic workup.  Patient was informed that the remainder of the evaluation will be completed by another provider, this initial triage assessment does not replace that evaluation, and the importance of remaining in the ED until their evaluation is complete.    Roxy Horseman, PA-C 11/24/21 [phone]

## 2021-12-23 DIAGNOSIS — Z419 Encounter for procedure for purposes other than remedying health state, unspecified: Secondary | ICD-10-CM | POA: Diagnosis not present

## 2022-01-22 DIAGNOSIS — Z419 Encounter for procedure for purposes other than remedying health state, unspecified: Secondary | ICD-10-CM | POA: Diagnosis not present

## 2022-02-15 ENCOUNTER — Ambulatory Visit: Payer: Medicaid Other

## 2022-02-15 NOTE — Progress Notes (Incomplete)
Gestational HTN severe preeclampsia  Obesity

## 2022-02-22 DIAGNOSIS — Z419 Encounter for procedure for purposes other than remedying health state, unspecified: Secondary | ICD-10-CM | POA: Diagnosis not present

## 2022-03-03 ENCOUNTER — Encounter (HOSPITAL_COMMUNITY): Payer: Self-pay

## 2022-03-03 ENCOUNTER — Ambulatory Visit (HOSPITAL_COMMUNITY)
Admission: EM | Admit: 2022-03-03 | Discharge: 2022-03-03 | Disposition: A | Discharge status: Home / Self Care | Payer: Medicaid Other | Attending: Internal Medicine | Admitting: Internal Medicine

## 2022-03-03 DIAGNOSIS — Z113 Encounter for screening for infections with a predominantly sexual mode of transmission: Secondary | ICD-10-CM | POA: Diagnosis not present

## 2022-03-03 DIAGNOSIS — R300 Dysuria: Secondary | ICD-10-CM

## 2022-03-03 LAB — POCT URINALYSIS DIPSTICK, ED / UC
Bilirubin Urine: NEGATIVE
Glucose, UA: NEGATIVE mg/dL
Hgb urine dipstick: NEGATIVE
Ketones, ur: NEGATIVE mg/dL
Leukocytes,Ua: NEGATIVE
Nitrite: NEGATIVE
Protein, ur: NEGATIVE mg/dL
Specific Gravity, Urine: >=1.03 (ref 1.005–1.030)
Urobilinogen, UA: 0.2 mg/dL (ref 0.0–1.0)
pH: 5.5 (ref 5.0–8.0)

## 2022-03-03 LAB — POC URINE PREG, ED: Preg Test, Ur: NEGATIVE

## 2022-03-03 NOTE — ED Provider Notes (Signed)
[facility]    CSN: [id number] Arrival date & time: 03/03/22  1325      History   Chief Complaint Chief Complaint  Patient presents with   Dysuria    HPI Dana Solomon is a 26 y.o. female.   Patient presents urgent care for evaluation of dysuria and dark-colored urine for the last couple of days.  No urinary frequency, urgency, or hesitancy.  She denies vaginal symptoms including rash but would like to be tested for STDs today.  She states she has been "juicing" for her diet and has been drinking lots of water.  States she was sick last week with a cold and was drinking lots of electrolyte replacement drinks to help stay well-hydrated.  Denies frequent intake of urinary irritants, energy drinks, or caffeine.  No abdominal pain, nausea, vomiting, dizziness, constipation, diarrhea, blood or mucus to the stools, or headache.  No fever or chills reported.  She has not attempted use of any over-the-counter medications prior to arrival urgent care for symptoms.   Dysuria   Past Medical History:  Diagnosis Date   Gestational diabetes    first pregnancy   History of pre-eclampsia in prior pregnancy, currently pregnant    Hypertension    Medical history non-contributory    Pregnancy induced hypertension     Patient Active Problem List   Diagnosis Date Noted   Body mass index (BMI) 40.0-44.9, adult ([facility]) 10/01/2021   Vaginal delivery 10/01/2021   Gestational hypertension 03/19/2020   Acute blood loss anemia 03/19/2020   SVD (spontaneous vaginal delivery) 11/25 03/18/2020   Postpartum care following vaginal delivery 11/25 03/18/2020   History of severe pre-eclampsia 03/17/2020   Maternal obesity affecting pregnancy, antepartum 03/17/2020    Past Surgical History:  Procedure Laterality Date   Belly button      approx 10 years ago   naval     age 89y    OB History     Gravida  3   Para  3   Term  2   Preterm  1   AB      Living  3      SAB       IAB      Ectopic      Multiple  0   Live Births  3            Home Medications    Prior to Admission medications   Medication Sig Start Date End Date Taking? Authorizing Provider  phenazopyridine (PYRIDIUM) 200 MG tablet Take 1 tablet (200 mg total) by mouth 3 (three) times daily. 11/24/21   Jacalyn Lefevre, MD    Family History Family History  Problem Relation Age of Onset   Diabetes Father    Hypertension Father    Asthma Sister     Social History Social History   Tobacco Use   Smoking status: Never   Smokeless tobacco: Never  Vaping Use   Vaping Use: Never used  Substance Use Topics   Alcohol use: Not Currently   Drug use: Never     Allergies   Patient has no known allergies.   Review of Systems Review of Systems  Genitourinary:  Positive for dysuria.  Per HPI   Physical Exam Triage Vital Signs ED Triage Vitals  Enc Vitals Group     BP 03/03/22 1357 130/84     Pulse Rate 03/03/22 1357 89     Resp 03/03/22 1357 18     Temp 03/03/22  1357 98.9 F (37.2 C)     Temp Source 03/03/22 1357 Oral     SpO2 03/03/22 1357 97 %     Weight --      Height --      Head Circumference --      Peak Flow --      Pain Score 03/03/22 1358 2     Pain Loc --      Pain Edu? --      Excl. in Page? --    No data found.  Updated Vital Signs BP 130/84 (BP Location: Right Arm)   Pulse 89   Temp 98.9 F (37.2 C) (Oral)   Resp 18   SpO2 97%   Breastfeeding No   Visual Acuity Right Eye Distance:   Left Eye Distance:   Bilateral Distance:    Right Eye Near:   Left Eye Near:    Bilateral Near:     Physical Exam Vitals and nursing note reviewed.  Constitutional:      Appearance: She is not ill-appearing or toxic-appearing.  HENT:     Head: Normocephalic and atraumatic.     Right Ear: Hearing and external ear normal.     Left Ear: Hearing and external ear normal.     Nose: Nose normal.     Mouth/Throat:     Lips: Pink.     Mouth: Mucous membranes are  moist.     Pharynx: No posterior oropharyngeal erythema.  Eyes:     General: Lids are normal. Vision grossly intact. Gaze aligned appropriately.     Extraocular Movements: Extraocular movements intact.     Conjunctiva/sclera: Conjunctivae normal.  Pulmonary:     Effort: Pulmonary effort is normal.  Genitourinary:    Comments: Deferred. Musculoskeletal:     Cervical back: Neck supple.  Skin:    General: Skin is warm and dry.     Capillary Refill: Capillary refill takes less than 2 seconds.     Findings: No rash.  Neurological:     General: No focal deficit present.     Mental Status: She is alert and oriented to person, place, and time. Mental status is at baseline.     Cranial Nerves: No dysarthria or facial asymmetry.  Psychiatric:        Mood and Affect: Mood normal.        Speech: Speech normal.        Behavior: Behavior normal.        Thought Content: Thought content normal.        Judgment: Judgment normal.      UC Treatments / Results  Labs (all labs ordered are listed, but only abnormal results are displayed) Labs Reviewed  POCT URINALYSIS DIPSTICK, ED / UC  POC URINE PREG, ED  CERVICOVAGINAL ANCILLARY ONLY    EKG   Radiology No results found.  Procedures Procedures (including critical care time)  Medications Ordered in UC Medications - No data to display  Initial Impression / Assessment and Plan / UC Course  I have reviewed the triage vital signs and the nursing notes.  Pertinent labs & imaging results that were available during my care of the patient were reviewed by me and considered in my medical decision making (see chart for details).   1.  Dysuria Urinalysis is unremarkable for signs of urinary tract infection but does show elevated specific gravity indicating mild dehydration.  She does not appear dehydrated physical exam and is nontoxic in appearance with hemodynamically stable  vital signs.  Patient to increase water intake to at least 64  ounces of water per day to stay well-hydrated.  Follow-up with PCP as needed.  2.  STD screening STI labs pending.  Will notify patient of positive results and treat accordingly when labs come back.  Patient to avoid sexual intercourse until screening testing comes back.  Education provided regarding safe sexual practices and patient encouraged to use protection to prevent spread of STIs.  Urine pregnancy is negative.  Discussed physical exam and available lab work findings in clinic with patient.  Counseled patient regarding appropriate use of medications and potential side effects for all medications recommended or prescribed today. Discussed red flag signs and symptoms of worsening condition,when to call the PCP office, return to urgent care, and when to seek higher level of care in the emergency department. Patient verbalizes understanding and agreement with plan. All questions answered. Patient discharged in stable condition.     Final Clinical Impressions(s) / UC Diagnoses   Final diagnoses:  Dysuria  Screening examination for STD (sexually transmitted disease)     Discharge Instructions      Your STD testing has been sent to the lab and will come back in the next 2 to 3 days.  We will call you if any of your results are positive requiring treatment and treat you at that time.   Avoid sexual intercourse until your STD results come back.  If any of your STD results are positive, you will need to avoid sexual intercourse for 7 days while you are being treated to prevent spread of STD.  Condom use is the best way to prevent spread of STDs.  Return to urgent care as needed.    ED Prescriptions   None    PDMP not reviewed this encounter.   Talbot Grumbling, [place] 03/03/22 1517

## 2022-03-03 NOTE — ED Triage Notes (Signed)
Patient with c/o burning with urination. Also requesting to be tested for STDs.

## 2022-03-03 NOTE — Discharge Instructions (Signed)
Your STD testing has been sent to the lab and will come back in the next 2 to 3 days.  We will call you if any of your results are positive requiring treatment and treat you at that time.   Avoid sexual intercourse until your STD results come back.  If any of your STD results are positive, you will need to avoid sexual intercourse for 7 days while you are being treated to prevent spread of STD.  Condom use is the best way to prevent spread of STDs.  Return to urgent care as needed.  

## 2022-03-06 LAB — CERVICOVAGINAL ANCILLARY ONLY
Bacterial Vaginitis (gardnerella): NEGATIVE
Candida Glabrata: NEGATIVE
Candida Vaginitis: NEGATIVE
Chlamydia: NEGATIVE
Comment: NEGATIVE
Comment: NEGATIVE
Comment: NEGATIVE
Comment: NEGATIVE
Comment: NEGATIVE
Comment: NORMAL
Neisseria Gonorrhea: NEGATIVE
Trichomonas: NEGATIVE

## 2022-03-24 DIAGNOSIS — Z419 Encounter for procedure for purposes other than remedying health state, unspecified: Secondary | ICD-10-CM | POA: Diagnosis not present

## 2022-04-21 ENCOUNTER — Ambulatory Visit (HOSPITAL_COMMUNITY): Admit: 2022-04-21 | Payer: Medicaid Other

## 2022-04-21 ENCOUNTER — Ambulatory Visit
Admission: EM | Admit: 2022-04-21 | Discharge: 2022-04-21 | Disposition: A | Discharge status: Home / Self Care | Payer: Medicaid Other | Attending: Physician Assistant | Admitting: Physician Assistant

## 2022-04-21 DIAGNOSIS — J069 Acute upper respiratory infection, unspecified: Secondary | ICD-10-CM | POA: Insufficient documentation

## 2022-04-21 DIAGNOSIS — R112 Nausea with vomiting, unspecified: Secondary | ICD-10-CM | POA: Diagnosis not present

## 2022-04-21 DIAGNOSIS — J029 Acute pharyngitis, unspecified: Secondary | ICD-10-CM | POA: Insufficient documentation

## 2022-04-21 LAB — POCT RAPID STREP A (OFFICE): Rapid Strep A Screen: NEGATIVE

## 2022-04-21 MED ORDER — LIDOCAINE VISCOUS HCL 2 % MT SOLN: 5.0000 mL | Freq: Four times a day (QID) | OROMUCOSAL | 0 refills | Status: DC

## 2022-04-21 MED ORDER — ONDANSETRON HCL 4 MG PO TABS: 4.0000 mg | ORAL_TABLET | Freq: Three times a day (TID) | ORAL | 0 refills | Status: DC | PRN

## 2022-04-21 NOTE — ED Triage Notes (Addendum)
Pt presents to [facility] with co of sore throat for one week with new onset of drooling increase

## 2022-04-21 NOTE — ED Provider Notes (Signed)
[facility]    CSN: [id number] Arrival date & time: 04/21/22  1134      History   Chief Complaint Chief Complaint  Patient presents with   Sore Throat    HPI Dana Solomon is a 26 y.o. female.   26 year old female presents with sore throat, cough and congestion.  Patient indicates she has been caring for her sick children at home who have had a combination of RSV, flu, stomach virus.  Patient indicates over the past week she has had upper respiratory symptoms of nasal congestion, sinus congestion and postnasal drip.  She relates she has had sore throat that has been present for the past week but worse over the past couple days, painful swallowing, throat pain when talking.  Patient indicates that her appetite is decreased due to the sore throat.  Patient also indicates she has had chest congestion with intermittent cough.  She also indicates having stomach pain, cramping, nausea with repeated vomiting over the past 24 hours.  Patient denies diarrhea.  Patient also indicates she is not having any fever or chills.  She is concerned about possibly having strep and requesting strep test.  Patient is tolerating fluids well.   Sore Throat    Past Medical History:  Diagnosis Date   Gestational diabetes    first pregnancy   History of pre-eclampsia in prior pregnancy, currently pregnant    Hypertension    Medical history non-contributory    Pregnancy induced hypertension     Patient Active Problem List   Diagnosis Date Noted   Body mass index (BMI) 40.0-44.9, adult ([place]) 10/01/2021   Vaginal delivery 10/01/2021   Gestational hypertension 03/19/2020   Acute blood loss anemia 03/19/2020   SVD (spontaneous vaginal delivery) 11/25 03/18/2020   Postpartum care following vaginal delivery 11/25 03/18/2020   History of severe pre-eclampsia 03/17/2020   Maternal obesity affecting pregnancy, antepartum 03/17/2020    Past Surgical History:  Procedure Laterality Date    Belly button      approx 10 years ago   naval     age 30y    OB History     Gravida  3   Para  3   Term  2   Preterm  1   AB      Living  3      SAB      IAB      Ectopic      Multiple  0   Live Births  3            Home Medications    Prior to Admission medications   Medication Sig Start Date End Date Taking? Authorizing Provider  magic mouthwash (lidocaine, diphenhydrAMINE, alum & mag hydroxide) suspension Swish and swallow 5 mLs 4 (four) times daily. 04/21/22  Yes Nyoka Lint, PA-C  ondansetron (ZOFRAN) 4 MG tablet Take 1 tablet (4 mg total) by mouth every 8 (eight) hours as needed for nausea or vomiting. 04/21/22  Yes Nyoka Lint, PA-C  phenazopyridine (PYRIDIUM) 200 MG tablet Take 1 tablet (200 mg total) by mouth 3 (three) times daily. Patient not taking: Reported on 04/21/2022 11/24/21   Isla Pence, MD    Family History Family History  Problem Relation Age of Onset   Diabetes Father    Hypertension Father    Asthma Sister     Social History Social History   Tobacco Use   Smoking status: Never   Smokeless tobacco: Never  Vaping Use   Vaping  Use: Never used  Substance Use Topics   Alcohol use: Not Currently   Drug use: Never     Allergies   Patient has no known allergies.   Review of Systems Review of Systems  HENT:  Positive for postnasal drip, rhinorrhea and sore throat.   Gastrointestinal:  Positive for nausea and vomiting.     Physical Exam Triage Vital Signs ED Triage Vitals  Enc Vitals Group     BP 04/21/22 1306 120/77     Pulse Rate 04/21/22 1306 87     Resp 04/21/22 1306 19     Temp 04/21/22 1306 98.9 F (37.2 C)     Temp src --      SpO2 04/21/22 1306 98 %     Weight --      Height --      Head Circumference --      Peak Flow --      Pain Score 04/21/22 1304 8     Pain Loc --      Pain Edu? --      Excl. in GC? --    No data found.  Updated Vital Signs BP 120/77   Pulse 87   Temp 98.9 F (37.2  C)   Resp 19   LMP 04/16/2022   SpO2 98%   Visual Acuity Right Eye Distance:   Left Eye Distance:   Bilateral Distance:    Right Eye Near:   Left Eye Near:    Bilateral Near:     Physical Exam Constitutional:      Appearance: She is well-developed.  HENT:     Right Ear: Tympanic membrane and ear canal normal.     Left Ear: Tympanic membrane and ear canal normal.     Mouth/Throat:     Mouth: Mucous membranes are moist.     Pharynx: Oropharynx is clear. Posterior oropharyngeal erythema present. No oropharyngeal exudate.  Cardiovascular:     Rate and Rhythm: Normal rate and regular rhythm.     Heart sounds: Normal heart sounds.  Pulmonary:     Effort: Pulmonary effort is normal.     Breath sounds: Normal breath sounds and air entry. No wheezing, rhonchi or rales.  Lymphadenopathy:     Cervical: No cervical adenopathy.  Neurological:     Mental Status: She is alert.      UC Treatments / Results  Labs (all labs ordered are listed, but only abnormal results are displayed) Labs Reviewed  CULTURE, GROUP A STREP [facility])  POCT RAPID STREP A (OFFICE)    EKG   Radiology No results found.  Procedures Procedures (including critical care time)  Medications Ordered in UC Medications - No data to display  Initial Impression / Assessment and Plan / UC Course  I have reviewed the triage vital signs and the nursing notes.  Pertinent labs & imaging results that were available during my care of the patient were reviewed by me and considered in my medical decision making (see chart for details).    Plan: 1.  The sore throat will be treated with the following: A.  Magic mouthwash 4 times daily to treat the sore throat. B.  Throat Culture is pending. 2.  The nausea and vomiting will be treated with the following: A.  Zofran 4 mg every 6 hours for the nausea. 3.  The upper respiratory tract infection be treated following: A.  Advised patient to use Mucinex DM as needed for  cough congestion. 4.  Patient  advised to follow-up with PCP or return to urgent care if symptoms fail to improve in the next week. Final Clinical Impressions(s) / UC Diagnoses   Final diagnoses:  Acute upper respiratory infection  Sore throat  Nausea and vomiting, unspecified vomiting type     Discharge Instructions      Advised to use the Magic mouthwash, gargle for 60 seconds and then swallow.  Repeat this process 3-4 times a day to help soothe the sore throat.  (Be cautious with this as it has a numbing agent and the throat will be numb for 10 to 15 minutes after use so do not be drinking any cold or hot beverages or eating any products for about 15 to 20 minutes after using the mouthwash) Advised take the Zofran 4 mg every 6 hours as needed for nausea and stomach cramping.  Advised take Tylenol or ibuprofen for fever and body aches. Advised to follow-up with PCP or return to urgent care if symptoms fail to improve over the next 5 to 7 days.    ED Prescriptions     Medication Sig Dispense Auth. Provider   ondansetron (ZOFRAN) 4 MG tablet Take 1 tablet (4 mg total) by mouth every 8 (eight) hours as needed for nausea or vomiting. 20 tablet Nyoka Lint, PA-C   magic mouthwash (lidocaine, diphenhydrAMINE, alum & mag hydroxide) suspension Swish and swallow 5 mLs 4 (four) times daily. 180 mL Nyoka Lint, PA-C      PDMP not reviewed this encounter.   Nyoka Lint, PA-C 04/21/22 1325

## 2022-04-21 NOTE — Discharge Instructions (Signed)
Advised to use the Magic mouthwash, gargle for 60 seconds and then swallow.  Repeat this process 3-4 times a day to help soothe the sore throat.  (Be cautious with this as it has a numbing agent and the throat will be numb for 10 to 15 minutes after use so do not be drinking any cold or hot beverages or eating any products for about 15 to 20 minutes after using the mouthwash) Advised take the Zofran 4 mg every 6 hours as needed for nausea and stomach cramping.  Advised take Tylenol or ibuprofen for fever and body aches. Advised to follow-up with PCP or return to urgent care if symptoms fail to improve over the next 5 to 7 days.

## 2022-04-24 DIAGNOSIS — Z419 Encounter for procedure for purposes other than remedying health state, unspecified: Secondary | ICD-10-CM | POA: Diagnosis not present

## 2022-04-24 LAB — CULTURE, GROUP A STREP (THRC)

## 2022-05-09 ENCOUNTER — Encounter (HOSPITAL_COMMUNITY): Payer: Self-pay | Admitting: Emergency Medicine

## 2022-05-09 ENCOUNTER — Ambulatory Visit (HOSPITAL_COMMUNITY)
Admission: EM | Admit: 2022-05-09 | Discharge: 2022-05-09 | Disposition: A | Discharge status: Home / Self Care | Payer: Medicaid Other | Attending: Family Medicine | Admitting: Family Medicine

## 2022-05-09 DIAGNOSIS — Z1152 Encounter for screening for COVID-19: Secondary | ICD-10-CM | POA: Diagnosis not present

## 2022-05-09 DIAGNOSIS — J069 Acute upper respiratory infection, unspecified: Secondary | ICD-10-CM | POA: Insufficient documentation

## 2022-05-09 MED ORDER — BENZONATATE 100 MG PO CAPS: 100.0000 mg | ORAL_CAPSULE | Freq: Three times a day (TID) | ORAL | 0 refills | Status: DC | PRN

## 2022-05-09 MED ORDER — ONDANSETRON 4 MG PO TBDP: 4.0000 mg | ORAL_TABLET | Freq: Three times a day (TID) | ORAL | 0 refills | Status: DC | PRN

## 2022-05-09 MED ORDER — IBUPROFEN 800 MG PO TABS: 800.0000 mg | ORAL_TABLET | Freq: Three times a day (TID) | ORAL | 0 refills | Status: DC | PRN

## 2022-05-09 NOTE — Discharge Instructions (Signed)
Take ibuprofen 800 mg--1 tab every 8 hours as needed for pain.  Take benzonatate 100 mg, 1 tab every 8 hours as needed for cough.  Ondansetron dissolved in the mouth every 8 hours as needed for nausea or vomiting. Clear liquids and bland things to eat.   You have been swabbed for COVID, and the test will result in the next 24 hours. Our staff will call you if positive. If the COVID test is positive, you should quarantine for 5 days from the start of your symptoms.  On days 6-10 from the start of your illness, you should wear a mask if out in public.

## 2022-05-09 NOTE — ED Provider Notes (Addendum)
[facility]    CSN: [id number] Arrival date & time: 05/09/22  0931      History   Chief Complaint Chief Complaint  Patient presents with   Back Pain    HPI Dana Solomon is a 27 y.o. female.    Back Pain  Here for myalgia, cough, rhinorrhea, and sore throat.  Symptoms began about midnight or 1 AM this morning.  She has had nausea but no vomiting or diarrhea.  No fever at home.  She is not taking any ibuprofen or Tylenol this morning and her temperature here is normal.  Last menstrual cycle was December 24   Past Medical History:  Diagnosis Date   Gestational diabetes    first pregnancy   History of pre-eclampsia in prior pregnancy, currently pregnant    Hypertension    Medical history non-contributory    Pregnancy induced hypertension     Patient Active Problem List   Diagnosis Date Noted   Body mass index (BMI) 40.0-44.9, adult ([place]) 10/01/2021   Vaginal delivery 10/01/2021   Gestational hypertension 03/19/2020   Acute blood loss anemia 03/19/2020   SVD (spontaneous vaginal delivery) 11/25 03/18/2020   Postpartum care following vaginal delivery 11/25 03/18/2020   History of severe pre-eclampsia 03/17/2020   Maternal obesity affecting pregnancy, antepartum 03/17/2020    Past Surgical History:  Procedure Laterality Date   Belly button      approx 10 years ago   naval     age 18y    OB History     Gravida  3   Para  3   Term  2   Preterm  1   AB      Living  3      SAB      IAB      Ectopic      Multiple  0   Live Births  3            Home Medications    Prior to Admission medications   Medication Sig Start Date End Date Taking? Authorizing Provider  benzonatate (TESSALON) 100 MG capsule Take 1 capsule (100 mg total) by mouth 3 (three) times daily as needed for cough. 05/09/22  Yes Barrett Henle, MD  ibuprofen (ADVIL) 800 MG tablet Take 1 tablet (800 mg total) by mouth every 8 (eight) hours as needed (pain).  05/09/22  Yes Barrett Henle, MD  ondansetron (ZOFRAN-ODT) 4 MG disintegrating tablet Take 1 tablet (4 mg total) by mouth every 8 (eight) hours as needed for nausea or vomiting. 05/09/22  Yes Cassidie Veiga, Gwenlyn Perking, MD    Family History Family History  Problem Relation Age of Onset   Diabetes Father    Hypertension Father    Asthma Sister     Social History Social History   Tobacco Use   Smoking status: Never   Smokeless tobacco: Never  Vaping Use   Vaping Use: Never used  Substance Use Topics   Alcohol use: Not Currently   Drug use: Never     Allergies   Patient has no known allergies.   Review of Systems Review of Systems  Musculoskeletal:  Positive for back pain.     Physical Exam Triage Vital Signs ED Triage Vitals  Enc Vitals Group     BP 05/09/22 0951 130/84     Pulse Rate 05/09/22 0951 (!) 105     Resp 05/09/22 0951 18     Temp 05/09/22 0951 98.9 F (37.2 C)  Temp Source 05/09/22 0951 Oral     SpO2 05/09/22 0951 99 %     Weight --      Height --      Head Circumference --      Peak Flow --      Pain Score 05/09/22 0952 10     Pain Loc --      Pain Edu? --      Excl. in [place]? --    No data found.  Updated Vital Signs BP 130/84 (BP Location: Left Arm)   Pulse (!) 105   Temp 98.9 F (37.2 C) (Oral)   Resp 18   LMP 04/16/2022   SpO2 99%   Visual Acuity Right Eye Distance:   Left Eye Distance:   Bilateral Distance:    Right Eye Near:   Left Eye Near:    Bilateral Near:     Physical Exam Vitals reviewed.  Constitutional:      General: She is not in acute distress.    Appearance: She is not toxic-appearing.  HENT:     Right Ear: Tympanic membrane and ear canal normal.     Left Ear: Tympanic membrane and ear canal normal.     Nose: Congestion present.     Mouth/Throat:     Mouth: Mucous membranes are moist.     Comments: Tonsillar hypertrophy.  There is some clear mucus draining Eyes:     Extraocular Movements: Extraocular  movements intact.     Conjunctiva/sclera: Conjunctivae normal.     Pupils: Pupils are equal, round, and reactive to light.  Cardiovascular:     Rate and Rhythm: Normal rate and regular rhythm.     Heart sounds: No murmur heard. Pulmonary:     Effort: Pulmonary effort is normal. No respiratory distress.     Breath sounds: No stridor. No wheezing, rhonchi or rales.  Musculoskeletal:     Cervical back: Neck supple.  Lymphadenopathy:     Cervical: No cervical adenopathy.  Skin:    Capillary Refill: Capillary refill takes less than 2 seconds.     Coloration: Skin is not jaundiced or pale.  Neurological:     General: No focal deficit present.     Mental Status: She is alert and oriented to person, place, and time.  Psychiatric:        Behavior: Behavior normal.      UC Treatments / Results  Labs (all labs ordered are listed, but only abnormal results are displayed) Labs Reviewed  SARS CORONAVIRUS 2 (TAT 6-24 HRS)    EKG   Radiology No results found.  Procedures Procedures (including critical care time)  Medications Ordered in UC Medications - No data to display  Initial Impression / Assessment and Plan / UC Course  I have reviewed the triage vital signs and the nursing notes.  Pertinent labs & imaging results that were available during my care of the patient were reviewed by me and considered in my medical decision making (see chart for details).        She declines my offer of a Toradol shot.  Medication is sent in for the cough, nausea, and pain.  COVID swab is done today, and if positive, she is a candidate for Paxlovid.  Her last EGFR in August 2023 was greater than 60 Final Clinical Impressions(s) / UC Diagnoses   Final diagnoses:  Viral URI with cough     Discharge Instructions      Take ibuprofen 800 mg--1 tab every  8 hours as needed for pain.  Take benzonatate 100 mg, 1 tab every 8 hours as needed for cough.  Ondansetron dissolved in the mouth  every 8 hours as needed for nausea or vomiting. Clear liquids and bland things to eat.   You have been swabbed for COVID, and the test will result in the next 24 hours. Our staff will call you if positive. If the COVID test is positive, you should quarantine for 5 days from the start of your symptoms.  On days 6-10 from the start of your illness, you should wear a mask if out in public.       ED Prescriptions     Medication Sig Dispense Auth. Provider   benzonatate (TESSALON) 100 MG capsule Take 1 capsule (100 mg total) by mouth 3 (three) times daily as needed for cough. 21 capsule Zenia Resides, MD   ibuprofen (ADVIL) 800 MG tablet Take 1 tablet (800 mg total) by mouth every 8 (eight) hours as needed (pain). 21 tablet Othmar Ringer, Janace Aris, MD   ondansetron (ZOFRAN-ODT) 4 MG disintegrating tablet Take 1 tablet (4 mg total) by mouth every 8 (eight) hours as needed for nausea or vomiting. 10 tablet Marlinda Mike Janace Aris, MD      PDMP not reviewed this encounter.   Zenia Resides, MD 05/09/22 1026    Zenia Resides, MD 05/09/22 1041

## 2022-05-09 NOTE — ED Triage Notes (Signed)
Back pain, abdominal pain, sore throat, cough, headache, nasal congestion, generalized body aches starting this morning. Has not taken any OTC meds

## 2022-05-10 LAB — SARS CORONAVIRUS 2 (TAT 6-24 HRS): SARS Coronavirus 2: NEGATIVE

## 2022-05-25 DIAGNOSIS — Z419 Encounter for procedure for purposes other than remedying health state, unspecified: Secondary | ICD-10-CM | POA: Diagnosis not present

## 2022-06-04 ENCOUNTER — Ambulatory Visit
Admission: EM | Admit: 2022-06-04 | Discharge: 2022-06-04 | Disposition: A | Discharge status: Home / Self Care | Payer: Medicaid Other | Attending: Internal Medicine | Admitting: Internal Medicine

## 2022-06-04 DIAGNOSIS — K1120 Sialoadenitis, unspecified: Secondary | ICD-10-CM | POA: Diagnosis not present

## 2022-06-04 MED ORDER — AMOXICILLIN-POT CLAVULANATE 875-125 MG PO TABS: 1.0000 | ORAL_TABLET | Freq: Two times a day (BID) | ORAL | 0 refills | Status: DC

## 2022-06-04 NOTE — ED Triage Notes (Signed)
Pt woke up with bump under tongue that is mildly painful and is annoying when she talks. No pain when she is not touching it.

## 2022-06-04 NOTE — Discharge Instructions (Addendum)
Follow-up with dentist and/or urgent care if symptoms persist or worsen.  I have prescribed antibiotic for salivary gland infection.

## 2022-06-04 NOTE — ED Provider Notes (Signed)
[facility]    CSN: [id number] Arrival date & time: 06/04/22  1432      History   Chief Complaint Chief Complaint  Patient presents with   Mouth Lesions    HPI Dana Solomon is a 27 y.o. female.   Patient presents with bump under tongue that she noticed this morning upon awakening.  She reports that it was draining some fluid.  Denies history of the same.  Denies any associated fever.   Mouth Lesions   Past Medical History:  Diagnosis Date   Gestational diabetes    first pregnancy   History of pre-eclampsia in prior pregnancy, currently pregnant    Hypertension    Medical history non-contributory    Pregnancy induced hypertension     Patient Active Problem List   Diagnosis Date Noted   Body mass index (BMI) 40.0-44.9, adult ([place]) 10/01/2021   Vaginal delivery 10/01/2021   Gestational hypertension 03/19/2020   Acute blood loss anemia 03/19/2020   SVD (spontaneous vaginal delivery) 11/25 03/18/2020   Postpartum care following vaginal delivery 11/25 03/18/2020   History of severe pre-eclampsia 03/17/2020   Maternal obesity affecting pregnancy, antepartum 03/17/2020    Past Surgical History:  Procedure Laterality Date   Belly button      approx 10 years ago   naval     age 32y    OB History     Gravida  3   Para  3   Term  2   Preterm  1   AB      Living  3      SAB      IAB      Ectopic      Multiple  0   Live Births  3            Home Medications    Prior to Admission medications   Medication Sig Start Date End Date Taking? Authorizing Provider  amoxicillin-clavulanate (AUGMENTIN) 875-125 MG tablet Take 1 tablet by mouth every 12 (twelve) hours. 06/04/22  Yes Worth Kober, Hildred Alamin E, FNP  benzonatate (TESSALON) 100 MG capsule Take 1 capsule (100 mg total) by mouth 3 (three) times daily as needed for cough. 05/09/22   Barrett Henle, MD  ibuprofen (ADVIL) 800 MG tablet Take 1 tablet (800 mg total) by mouth every 8  (eight) hours as needed (pain). 05/09/22   Barrett Henle, MD  ondansetron (ZOFRAN-ODT) 4 MG disintegrating tablet Take 1 tablet (4 mg total) by mouth every 8 (eight) hours as needed for nausea or vomiting. 05/09/22   Barrett Henle, MD    Family History Family History  Problem Relation Age of Onset   Diabetes Father    Hypertension Father    Asthma Sister     Social History Social History   Tobacco Use   Smoking status: Never   Smokeless tobacco: Never  Vaping Use   Vaping Use: Never used  Substance Use Topics   Alcohol use: Not Currently   Drug use: Never     Allergies   Patient has no known allergies.   Review of Systems Review of Systems Per HPI  Physical Exam Triage Vital Signs ED Triage Vitals  Enc Vitals Group     BP 06/04/22 1534 117/89     Pulse Rate 06/04/22 1534 89     Resp 06/04/22 1534 16     Temp 06/04/22 1534 (!) 97.4 F (36.3 C)     Temp src --  SpO2 06/04/22 1534 96 %     Weight --      Height --      Head Circumference --      Peak Flow --      Pain Score 06/04/22 1533 0     Pain Loc --      Pain Edu? --      Excl. in [place]? --    No data found.  Updated Vital Signs BP 117/89   Pulse 89   Temp (!) 97.4 F (36.3 C)   Resp 16   LMP 05/16/2022 (Approximate)   SpO2 96%   Visual Acuity Right Eye Distance:   Left Eye Distance:   Bilateral Distance:    Right Eye Near:   Left Eye Near:    Bilateral Near:     Physical Exam Constitutional:      General: She is not in acute distress.    Appearance: Normal appearance. She is not toxic-appearing or diaphoretic.  HENT:     Head: Normocephalic and atraumatic.     Mouth/Throat:     Comments: Patient has very minimally swollen and erythematous lesion present underneath left portion of tongue on the floor of the mouth.  Consistent with possible salivary gland infection. Eyes:     Extraocular Movements: Extraocular movements intact.     Conjunctiva/sclera: Conjunctivae normal.   Pulmonary:     Effort: Pulmonary effort is normal.  Neurological:     General: No focal deficit present.     Mental Status: She is alert and oriented to person, place, and time. Mental status is at baseline.  Psychiatric:        Mood and Affect: Mood normal.        Behavior: Behavior normal.        Thought Content: Thought content normal.        Judgment: Judgment normal.      UC Treatments / Results  Labs (all labs ordered are listed, but only abnormal results are displayed) Labs Reviewed - No data to display  EKG   Radiology No results found.  Procedures Procedures (including critical care time)  Medications Ordered in UC Medications - No data to display  Initial Impression / Assessment and Plan / UC Course  I have reviewed the triage vital signs and the nursing notes.  Pertinent labs & imaging results that were available during my care of the patient were reviewed by me and considered in my medical decision making (see chart for details).     Patient's physical exam is consistent with most likely possible salivary gland infection.  It is very minimal and airway is normal so do not think that emergent evaluation is necessary.  Will treat with Augmentin antibiotic.  Advised patient to follow-up with dentist and urgent care if symptoms persist or worsen.  Advised her that sour candy and chewing gum can be helpful as well.  Discussed return precautions.  Patient verbalized understanding and was agreeable with plan.  She denies that she is breast-feeding or has any concerns for pregnancy so this medication should be safe. Final Clinical Impressions(s) / UC Diagnoses   Final diagnoses:  Salivary gland infection     Discharge Instructions      Follow-up with dentist and/or urgent care if symptoms persist or worsen.  I have prescribed antibiotic for salivary gland infection.    ED Prescriptions     Medication Sig Dispense Auth. Provider   amoxicillin-clavulanate  (AUGMENTIN) 875-125 MG tablet Take 1 tablet  by mouth every 12 (twelve) hours. 14 tablet [place], Michele Rockers, [place]      PDMP not reviewed this encounter.   Teodora Medici, [place] 06/04/22 [id number]

## 2022-06-15 DIAGNOSIS — Z113 Encounter for screening for infections with a predominantly sexual mode of transmission: Secondary | ICD-10-CM | POA: Diagnosis not present

## 2022-06-15 DIAGNOSIS — Z0001 Encounter for general adult medical examination with abnormal findings: Secondary | ICD-10-CM | POA: Diagnosis not present

## 2022-06-15 DIAGNOSIS — Z3040 Encounter for surveillance of contraceptives, unspecified: Secondary | ICD-10-CM | POA: Diagnosis not present

## 2022-06-15 DIAGNOSIS — Z6841 Body Mass Index (BMI) 40.0 and over, adult: Secondary | ICD-10-CM | POA: Diagnosis not present

## 2022-06-23 DIAGNOSIS — Z419 Encounter for procedure for purposes other than remedying health state, unspecified: Secondary | ICD-10-CM | POA: Diagnosis not present

## 2022-07-10 ENCOUNTER — Ambulatory Visit (HOSPITAL_COMMUNITY)
Admission: EM | Admit: 2022-07-10 | Discharge: 2022-07-10 | Disposition: A | Discharge status: Home / Self Care | Payer: Medicaid Other | Attending: Emergency Medicine | Admitting: Emergency Medicine

## 2022-07-10 ENCOUNTER — Other Ambulatory Visit: Payer: Self-pay

## 2022-07-10 DIAGNOSIS — B349 Viral infection, unspecified: Secondary | ICD-10-CM | POA: Diagnosis not present

## 2022-07-10 LAB — POC URINE PREG, ED: Preg Test, Ur: NEGATIVE

## 2022-07-10 MED ORDER — ACETAMINOPHEN 500 MG PO TABS: 500.0000 mg | ORAL_TABLET | Freq: Four times a day (QID) | ORAL | 2 refills | Status: DC | PRN

## 2022-07-10 MED ORDER — ONDANSETRON 4 MG PO TBDP
4.0000 mg | ORAL_TABLET | Freq: Once | ORAL | Status: AC
  Administered 2022-07-10: 4 mg via ORAL

## 2022-07-10 MED ORDER — ONDANSETRON 4 MG PO TBDP
ORAL_TABLET | ORAL | Status: AC
  Filled 2022-07-10: qty 1

## 2022-07-10 MED ORDER — ONDANSETRON 4 MG PO TBDP: 4.0000 mg | ORAL_TABLET | Freq: Four times a day (QID) | ORAL | 0 refills | Status: DC | PRN

## 2022-07-10 NOTE — ED Triage Notes (Signed)
Pt reports N/V ,runny nose and back pain for 2-3 days.

## 2022-07-10 NOTE — ED Provider Notes (Signed)
[place]    CSN: [id number] Arrival date & time: 07/10/22  [id number]      History   Chief Complaint Chief Complaint  Patient presents with   Emesis   Nausea   Back Pain   Possible Pregnancy    HPI Dana Solomon is a 27 y.o. female.  2 day history of nausea, vomiting, low back pain 4-5 episodes of emesis after eating. Tolerating fluids Back pain is 9/10. No meds taken.  She reports this is from history of epidural No urinary symptoms LMP unknown  Kids are sick with vomiting too  Past Medical History:  Diagnosis Date   Gestational diabetes    first pregnancy   History of pre-eclampsia in prior pregnancy, currently pregnant    Hypertension    Medical history non-contributory    Pregnancy induced hypertension     Patient Active Problem List   Diagnosis Date Noted   Body mass index (BMI) 40.0-44.9, adult ([place]) 10/01/2021   Vaginal delivery 10/01/2021   Gestational hypertension 03/19/2020   Acute blood loss anemia 03/19/2020   SVD (spontaneous vaginal delivery) 11/25 03/18/2020   Postpartum care following vaginal delivery 11/25 03/18/2020   History of severe pre-eclampsia 03/17/2020   Maternal obesity affecting pregnancy, antepartum 03/17/2020    Past Surgical History:  Procedure Laterality Date   Belly button      approx 10 years ago   naval     age 48y    OB History     Gravida  3   Para  3   Term  2   Preterm  1   AB      Living  3      SAB      IAB      Ectopic      Multiple  0   Live Births  3            Home Medications    Prior to Admission medications   Medication Sig Start Date End Date Taking? Authorizing Provider  acetaminophen (TYLENOL) 500 MG tablet Take 1 tablet (500 mg total) by mouth every 6 (six) hours as needed. 07/10/22  Yes Lathan Gieselman, Wells Guiles, PA-C  ondansetron (ZOFRAN-ODT) 4 MG disintegrating tablet Take 1 tablet (4 mg total) by mouth every 6 (six) hours as needed for nausea or vomiting. 07/10/22  Yes  Trinton Prewitt, Wells Guiles, PA-C    Family History Family History  Problem Relation Age of Onset   Diabetes Father    Hypertension Father    Asthma Sister     Social History Social History   Tobacco Use   Smoking status: Never   Smokeless tobacco: Never  Vaping Use   Vaping Use: Never used  Substance Use Topics   Alcohol use: Not Currently   Drug use: Never     Allergies   Patient has no known allergies.   Review of Systems Review of Systems As per HPI  Physical Exam Triage Vital Signs ED Triage Vitals  Enc Vitals Group     BP 07/10/22 0909 97/67     Pulse Rate 07/10/22 0909 (!) 109     Resp 07/10/22 0909 20     Temp 07/10/22 0909 98.4 F (36.9 C)     Temp src --      SpO2 07/10/22 0909 97 %     Weight --      Height --      Head Circumference --      Peak Flow --  Pain Score 07/10/22 0907 9     Pain Loc --      Pain Edu? --      Excl. in [place]? --    No data found.  Updated Vital Signs BP 97/67   Pulse (!) 109   Temp 98.4 F (36.9 C)   Resp 20   SpO2 97%   Physical Exam Vitals and nursing note reviewed.  Constitutional:      Appearance: She is not ill-appearing.  HENT:     Nose: No rhinorrhea.     Mouth/Throat:     Mouth: Mucous membranes are moist.     Pharynx: Oropharynx is clear. No posterior oropharyngeal erythema.  Eyes:     Conjunctiva/sclera: Conjunctivae normal.  Cardiovascular:     Rate and Rhythm: Normal rate and regular rhythm.     Pulses: Normal pulses.     Heart sounds: Normal heart sounds.  Pulmonary:     Effort: Pulmonary effort is normal.     Breath sounds: Normal breath sounds.  Abdominal:     General: There is no distension.     Palpations: Abdomen is soft.     Tenderness: There is no abdominal tenderness. There is no guarding.  Musculoskeletal:        General: No tenderness. Normal range of motion.     Cervical back: Normal range of motion.  Lymphadenopathy:     Cervical: No cervical adenopathy.  Skin:    General:  Skin is warm and dry.  Neurological:     Mental Status: She is alert and oriented to person, place, and time.     UC Treatments / Results  Labs (all labs ordered are listed, but only abnormal results are displayed) Labs Reviewed  POC URINE PREG, ED    EKG  Radiology No results found.  Procedures Procedures (including critical care time)  Medications Ordered in UC Medications  ondansetron (ZOFRAN-ODT) disintegrating tablet 4 mg (4 mg Oral Given 07/10/22 0931)    Initial Impression / Assessment and Plan / UC Course  I have reviewed the triage vital signs and the nursing notes.  Pertinent labs & imaging results that were available during my care of the patient were reviewed by me and considered in my medical decision making (see chart for details).  Viral gastroenteritis  UPT negative Zofran ODT with improvement in nausea. Can use q6 hours prn Recommend tylenol for aches Bland diet, increase fluids Return precautions discussed. Patient agrees to plan  Final Clinical Impressions(s) / UC Diagnoses   Final diagnoses:  Viral illness     Discharge Instructions      Zofran can be used every 6 hours to settle the stomach Drink lots of fluids! Bland diet as tolerated Tylenol for aches  I recommend once daily zyrtec or allegra      ED Prescriptions     Medication Sig Dispense Auth. Provider   ondansetron (ZOFRAN-ODT) 4 MG disintegrating tablet Take 1 tablet (4 mg total) by mouth every 6 (six) hours as needed for nausea or vomiting. 20 tablet Keandre Linden, PA-C   acetaminophen (TYLENOL) 500 MG tablet Take 1 tablet (500 mg total) by mouth every 6 (six) hours as needed. 30 tablet Mili Piltz, Wells Guiles, PA-C      PDMP not reviewed this encounter.   Jaray Boliver, Wells Guiles, [place] 07/10/22 1028

## 2022-07-10 NOTE — Discharge Instructions (Addendum)
Zofran can be used every 6 hours to settle the stomach Drink lots of fluids! Bland diet as tolerated Tylenol for aches  I recommend once daily zyrtec or allegra

## 2022-07-24 DIAGNOSIS — Z419 Encounter for procedure for purposes other than remedying health state, unspecified: Secondary | ICD-10-CM | POA: Diagnosis not present

## 2022-08-23 DIAGNOSIS — Z419 Encounter for procedure for purposes other than remedying health state, unspecified: Secondary | ICD-10-CM | POA: Diagnosis not present

## 2022-09-23 DIAGNOSIS — Z419 Encounter for procedure for purposes other than remedying health state, unspecified: Secondary | ICD-10-CM | POA: Diagnosis not present

## 2022-10-19 DIAGNOSIS — Z7689 Persons encountering health services in other specified circumstances: Secondary | ICD-10-CM | POA: Diagnosis not present

## 2022-10-20 DIAGNOSIS — Z7689 Persons encountering health services in other specified circumstances: Secondary | ICD-10-CM | POA: Diagnosis not present

## 2022-10-23 DIAGNOSIS — Z419 Encounter for procedure for purposes other than remedying health state, unspecified: Secondary | ICD-10-CM | POA: Diagnosis not present

## 2022-11-16 DIAGNOSIS — Z7689 Persons encountering health services in other specified circumstances: Secondary | ICD-10-CM | POA: Diagnosis not present

## 2022-11-20 DIAGNOSIS — Z7689 Persons encountering health services in other specified circumstances: Secondary | ICD-10-CM | POA: Diagnosis not present

## 2022-11-22 DIAGNOSIS — Z7689 Persons encountering health services in other specified circumstances: Secondary | ICD-10-CM | POA: Diagnosis not present

## 2022-11-23 DIAGNOSIS — Z419 Encounter for procedure for purposes other than remedying health state, unspecified: Secondary | ICD-10-CM | POA: Diagnosis not present

## 2022-12-24 DIAGNOSIS — Z419 Encounter for procedure for purposes other than remedying health state, unspecified: Secondary | ICD-10-CM | POA: Diagnosis not present

## 2023-01-23 DIAGNOSIS — Z7689 Persons encountering health services in other specified circumstances: Secondary | ICD-10-CM | POA: Diagnosis not present

## 2023-01-23 DIAGNOSIS — Z419 Encounter for procedure for purposes other than remedying health state, unspecified: Secondary | ICD-10-CM | POA: Diagnosis not present

## 2023-01-29 DIAGNOSIS — Z7689 Persons encountering health services in other specified circumstances: Secondary | ICD-10-CM | POA: Diagnosis not present

## 2023-02-23 DIAGNOSIS — Z419 Encounter for procedure for purposes other than remedying health state, unspecified: Secondary | ICD-10-CM | POA: Diagnosis not present

## 2023-03-25 DIAGNOSIS — Z419 Encounter for procedure for purposes other than remedying health state, unspecified: Secondary | ICD-10-CM | POA: Diagnosis not present

## 2023-03-29 DIAGNOSIS — Z7689 Persons encountering health services in other specified circumstances: Secondary | ICD-10-CM | POA: Diagnosis not present

## 2023-04-25 DIAGNOSIS — Z419 Encounter for procedure for purposes other than remedying health state, unspecified: Secondary | ICD-10-CM | POA: Diagnosis not present

## 2023-04-25 NOTE — L&D Delivery Note (Signed)
 Delivery Note:   [id number] at [redacted]w[redacted]d  Admitting diagnosis: Normal labor [O80, Z37.9] Risks: History of HSV, on Valtrex; Rh negative, s/p Rhogam; BMI 48 with AGA growth and reassuring ANFTing; anemia, on PO iron  Onset of labor: 04/07/2024 at 1400 IOL/Augmentation: N/A ROM: 04/07/2024 at 1420  Complete dilation at 04/07/2024 1624 Onset of pushing at 1625 FHR second stage Cat I  Analgesia/Anesthesia intrapartum:None Pushing in side lying position with CNM and L&D staff support. Partner, [name], present for birth and supportive.  Delivery of a Live born female  Birth Weight:  pending APGAR: 8, 9  Newborn Delivery   Birth date/time: 04/07/2024 16:24:00 Delivery type: Vaginal, Spontaneous    in cephalic presentation, position OA to LOA.  APGAR:1 min-8 , 5 min-9   Nuchal Cord: Yes  Cord not clamped or cut, desires placenta to remain intact until moved to postpartum.  Collection of cord blood for typing completed. Arterial cord blood sample-No   Placenta delivered-Spontaneous with 3 vessels. Uterotonics: Pitocin  Placenta to patient per request Uterine tone firm  Bleeding scant  None laceration identified.  Episiotomy:None Local analgesia: N/A  Repair: N/A Est. Blood Loss (mL):71.00  Complications: None  Mom to postpartum. Baby [name] to Couplet care / Skin to Skin.  Delivery Report:  Review the Delivery Report for details.    [name], CNM, MSN 04/07/2024, 5:44 PM

## 2023-05-14 DIAGNOSIS — Z7689 Persons encountering health services in other specified circumstances: Secondary | ICD-10-CM | POA: Diagnosis not present

## 2023-05-21 DIAGNOSIS — Z20822 Contact with and (suspected) exposure to covid-19: Secondary | ICD-10-CM | POA: Diagnosis not present

## 2023-05-21 DIAGNOSIS — R112 Nausea with vomiting, unspecified: Secondary | ICD-10-CM | POA: Diagnosis not present

## 2023-05-21 DIAGNOSIS — R197 Diarrhea, unspecified: Secondary | ICD-10-CM | POA: Diagnosis not present

## 2023-05-26 DIAGNOSIS — Z419 Encounter for procedure for purposes other than remedying health state, unspecified: Secondary | ICD-10-CM | POA: Diagnosis not present

## 2023-06-23 DIAGNOSIS — Z419 Encounter for procedure for purposes other than remedying health state, unspecified: Secondary | ICD-10-CM | POA: Diagnosis not present

## 2023-07-25 ENCOUNTER — Ambulatory Visit (HOSPITAL_COMMUNITY)
Admission: EM | Admit: 2023-07-25 | Discharge: 2023-07-25 | Disposition: A | Discharge status: Home / Self Care | Attending: Emergency Medicine | Admitting: Emergency Medicine

## 2023-07-25 ENCOUNTER — Encounter (HOSPITAL_COMMUNITY): Payer: Self-pay

## 2023-07-25 DIAGNOSIS — Z113 Encounter for screening for infections with a predominantly sexual mode of transmission: Secondary | ICD-10-CM | POA: Insufficient documentation

## 2023-07-25 DIAGNOSIS — N898 Other specified noninflammatory disorders of vagina: Secondary | ICD-10-CM | POA: Insufficient documentation

## 2023-07-25 DIAGNOSIS — Z3201 Encounter for pregnancy test, result positive: Secondary | ICD-10-CM | POA: Insufficient documentation

## 2023-07-25 LAB — POCT URINE PREGNANCY: Preg Test, Ur: POSITIVE — AB

## 2023-07-25 LAB — HIV ANTIBODY (ROUTINE TESTING W REFLEX): HIV Screen 4th Generation wRfx: NONREACTIVE

## 2023-07-25 NOTE — ED Provider Notes (Signed)
 [facility]    CSN: [id number] Arrival date & time: 07/25/23  1718      History   Chief Complaint Chief Complaint  Patient presents with   Vaginal Discharge    HPI Dana Solomon is a 28 y.o. female.   Patient presents with malodorous vaginal discharge and irritation x 3 days.  Patient reports recent unprotected sexual intercourse and is requesting STD testing.  Denies abdominal pain, flank pain, fever, abnormal vaginal bleeding, dysuria, urinary frequency/urgency, and hematuria.  Patient states that she took a pregnancy test at home and there was a faint positive line and would like to confirm here.  LMP 06/24/2023.  Currently not using any contraceptive methods.   Vaginal Discharge   Past Medical History:  Diagnosis Date   Gestational diabetes    first pregnancy   History of pre-eclampsia in prior pregnancy, currently pregnant    Hypertension    Medical history non-contributory    Pregnancy induced hypertension     Patient Active Problem List   Diagnosis Date Noted   Body mass index (BMI) 40.0-44.9, adult (HCC) 10/01/2021   Vaginal delivery 10/01/2021   Gestational hypertension 03/19/2020   Acute blood loss anemia 03/19/2020   SVD (spontaneous vaginal delivery) 11/25 03/18/2020   Postpartum care following vaginal delivery 11/25 03/18/2020   History of severe pre-eclampsia 03/17/2020   Maternal obesity affecting pregnancy, antepartum 03/17/2020    Past Surgical History:  Procedure Laterality Date   Belly button      approx 10 years ago   Higher education careers adviser     age 29y    OB History     Gravida  3   Para  3   Term  2   Preterm  1   AB      Living  3      SAB      IAB      Ectopic      Multiple  0   Live Births  3            Home Medications    Prior to Admission medications   Not on File    Family History Family History  Problem Relation Age of Onset   Diabetes Father    Hypertension Father    Asthma Sister      Social History Social History   Tobacco Use   Smoking status: Never   Smokeless tobacco: Never  Vaping Use   Vaping status: Never Used  Substance Use Topics   Alcohol use: Not Currently   Drug use: Never     Allergies   Patient has no known allergies.   Review of Systems Review of Systems  Genitourinary:  Positive for vaginal discharge.   Per HPI  Physical Exam Triage Vital Signs ED Triage Vitals [07/25/23 1906]  Encounter Vitals Group     BP 124/84     Systolic BP Percentile      Diastolic BP Percentile      Pulse Rate 98     Resp 18     Temp 98.1 F (36.7 C)     Temp Source Oral     SpO2 98 %     Weight      Height      Head Circumference      Peak Flow      Pain Score      Pain Loc      Pain Education      Exclude from Growth Chart  No data found.  Updated Vital Signs BP 124/84 (BP Location: Left Arm)   Pulse 98   Temp 98.1 F (36.7 C) (Oral)   Resp 18   LMP 06/24/2023 (Approximate)   SpO2 98%   Visual Acuity Right Eye Distance:   Left Eye Distance:   Bilateral Distance:    Right Eye Near:   Left Eye Near:    Bilateral Near:     Physical Exam Vitals and nursing note reviewed.  Constitutional:      General: She is awake. She is not in acute distress.    Appearance: Normal appearance. She is well-developed and well-groomed. She is not ill-appearing.  Genitourinary:    Comments: Exam deferred Neurological:     Mental Status: She is alert.  Psychiatric:        Behavior: Behavior is cooperative.      UC Treatments / Results  Labs (all labs ordered are listed, but only abnormal results are displayed) Labs Reviewed  POCT URINE PREGNANCY - Abnormal; Notable for the following components:      Result Value   Preg Test, Ur Positive (*)    All other components within normal limits  HIV ANTIBODY (ROUTINE TESTING W REFLEX)  RPR  CERVICOVAGINAL ANCILLARY ONLY    EKG   Radiology No results found.  Procedures Procedures  (including critical care time)  Medications Ordered in UC Medications - No data to display  Initial Impression / Assessment and Plan / UC Course  I have reviewed the triage vital signs and the nursing notes.  Pertinent labs & imaging results that were available during my care of the patient were reviewed by me and considered in my medical decision making (see chart for details).     GU exam deferred.  Patient performed self swab for STD/STI.  HIV and RPR ordered.  Urine pregnancy was positive today.  Discussed follow-up and return precautions. Final Clinical Impressions(s) / UC Diagnoses   Final diagnoses:  Vaginal discharge  Screening for STD (sexually transmitted disease)  Pregnancy test positive     Discharge Instructions      Your pregnancy test was positive today.  I have attached an OB/GYN that you can call and schedule an appointment with to get established with your pregnancy.  Your swab and blood results will come back over the next few days and someone will call if results are positive and require treatment.  Return here as needed.      ED Prescriptions   None    PDMP not reviewed this encounter.   Wynonia Lawman A, NP 07/25/23 1958

## 2023-07-25 NOTE — Discharge Instructions (Signed)
 Your pregnancy test was positive today.  I have attached an OB/GYN that you can call and schedule an appointment with to get established with your pregnancy.  Your swab and blood results will come back over the next few days and someone will call if results are positive and require treatment.  Return here as needed.

## 2023-07-25 NOTE — ED Triage Notes (Signed)
 Patient is here for vaginal discharge with odor x 3 days.

## 2023-07-26 LAB — CERVICOVAGINAL ANCILLARY ONLY
Bacterial Vaginitis (gardnerella): POSITIVE — AB
Candida Glabrata: NEGATIVE
Candida Vaginitis: POSITIVE — AB
Chlamydia: NEGATIVE
Comment: NEGATIVE
Comment: NEGATIVE
Comment: NEGATIVE
Comment: NEGATIVE
Comment: NEGATIVE
Comment: NORMAL
Neisseria Gonorrhea: NEGATIVE
Trichomonas: NEGATIVE

## 2023-07-26 LAB — RPR: RPR Ser Ql: NONREACTIVE

## 2023-07-27 ENCOUNTER — Telehealth (HOSPITAL_BASED_OUTPATIENT_CLINIC_OR_DEPARTMENT_OTHER): Payer: Self-pay

## 2023-07-27 MED ORDER — FLUCONAZOLE 150 MG PO TABS: 150.0000 mg | ORAL_TABLET | Freq: Once | ORAL | 0 refills | Status: AC

## 2023-07-27 MED ORDER — METRONIDAZOLE 500 MG PO TABS: 500.0000 mg | ORAL_TABLET | Freq: Two times a day (BID) | ORAL | 0 refills | Status: DC

## 2023-07-27 NOTE — Telephone Encounter (Signed)
 Per protocol, pt requires tx with metronidazole and Diflucan.  Rx sent to pharmacy on file.

## 2023-08-04 DIAGNOSIS — Z419 Encounter for procedure for purposes other than remedying health state, unspecified: Secondary | ICD-10-CM | POA: Diagnosis not present

## 2023-08-21 DIAGNOSIS — Z8759 Personal history of other complications of pregnancy, childbirth and the puerperium: Secondary | ICD-10-CM | POA: Diagnosis not present

## 2023-08-21 DIAGNOSIS — A609 Anogenital herpesviral infection, unspecified: Secondary | ICD-10-CM | POA: Diagnosis not present

## 2023-08-21 DIAGNOSIS — N912 Amenorrhea, unspecified: Secondary | ICD-10-CM | POA: Diagnosis not present

## 2023-08-21 DIAGNOSIS — O99213 Obesity complicating pregnancy, third trimester: Secondary | ICD-10-CM | POA: Diagnosis not present

## 2023-08-21 DIAGNOSIS — Z0183 Encounter for blood typing: Secondary | ICD-10-CM | POA: Diagnosis not present

## 2023-08-21 DIAGNOSIS — O210 Mild hyperemesis gravidarum: Secondary | ICD-10-CM | POA: Diagnosis not present

## 2023-08-21 DIAGNOSIS — Z113 Encounter for screening for infections with a predominantly sexual mode of transmission: Secondary | ICD-10-CM | POA: Diagnosis not present

## 2023-08-21 DIAGNOSIS — Z8619 Personal history of other infectious and parasitic diseases: Secondary | ICD-10-CM | POA: Diagnosis not present

## 2023-08-21 DIAGNOSIS — Z3A08 8 weeks gestation of pregnancy: Secondary | ICD-10-CM | POA: Diagnosis not present

## 2023-08-21 DIAGNOSIS — Z331 Pregnant state, incidental: Secondary | ICD-10-CM | POA: Diagnosis not present

## 2023-08-21 DIAGNOSIS — Z369 Encounter for antenatal screening, unspecified: Secondary | ICD-10-CM | POA: Diagnosis not present

## 2023-08-21 LAB — HEPATITIS C ANTIBODY: HCV Ab: NEGATIVE

## 2023-08-21 LAB — OB RESULTS CONSOLE HIV ANTIBODY (ROUTINE TESTING): HIV: NONREACTIVE

## 2023-08-21 LAB — OB RESULTS CONSOLE ANTIBODY SCREEN: Antibody Screen: NEGATIVE

## 2023-08-21 LAB — OB RESULTS CONSOLE RUBELLA ANTIBODY, IGM: Rubella: IMMUNE

## 2023-08-21 LAB — OB RESULTS CONSOLE HEPATITIS B SURFACE ANTIGEN: Hepatitis B Surface Ag: NEGATIVE

## 2023-09-03 DIAGNOSIS — Z419 Encounter for procedure for purposes other than remedying health state, unspecified: Secondary | ICD-10-CM | POA: Diagnosis not present

## 2023-09-10 DIAGNOSIS — R7309 Other abnormal glucose: Secondary | ICD-10-CM | POA: Diagnosis not present

## 2023-10-04 DIAGNOSIS — Z419 Encounter for procedure for purposes other than remedying health state, unspecified: Secondary | ICD-10-CM | POA: Diagnosis not present

## 2023-10-08 DIAGNOSIS — F329 Major depressive disorder, single episode, unspecified: Secondary | ICD-10-CM | POA: Diagnosis not present

## 2023-10-11 DIAGNOSIS — Z7689 Persons encountering health services in other specified circumstances: Secondary | ICD-10-CM | POA: Diagnosis not present

## 2023-10-16 ENCOUNTER — Other Ambulatory Visit: Payer: Self-pay | Admitting: Obstetrics and Gynecology

## 2023-10-16 DIAGNOSIS — O99213 Obesity complicating pregnancy, third trimester: Secondary | ICD-10-CM

## 2023-10-23 DIAGNOSIS — Z7689 Persons encountering health services in other specified circumstances: Secondary | ICD-10-CM | POA: Diagnosis not present

## 2023-10-23 DIAGNOSIS — F329 Major depressive disorder, single episode, unspecified: Secondary | ICD-10-CM | POA: Diagnosis not present

## 2023-11-03 DIAGNOSIS — Z419 Encounter for procedure for purposes other than remedying health state, unspecified: Secondary | ICD-10-CM | POA: Diagnosis not present

## 2023-11-07 DIAGNOSIS — Z7689 Persons encountering health services in other specified circumstances: Secondary | ICD-10-CM | POA: Diagnosis not present

## 2023-11-08 DIAGNOSIS — Z7689 Persons encountering health services in other specified circumstances: Secondary | ICD-10-CM | POA: Diagnosis not present

## 2023-11-08 DIAGNOSIS — Z113 Encounter for screening for infections with a predominantly sexual mode of transmission: Secondary | ICD-10-CM | POA: Diagnosis not present

## 2023-11-08 DIAGNOSIS — Z0000 Encounter for general adult medical examination without abnormal findings: Secondary | ICD-10-CM | POA: Diagnosis not present

## 2023-11-09 DIAGNOSIS — F329 Major depressive disorder, single episode, unspecified: Secondary | ICD-10-CM | POA: Diagnosis not present

## 2023-11-19 DIAGNOSIS — Z31430 Encounter of female for testing for genetic disease carrier status for procreative management: Secondary | ICD-10-CM | POA: Diagnosis not present

## 2023-12-03 ENCOUNTER — Other Ambulatory Visit

## 2023-12-04 DIAGNOSIS — Z419 Encounter for procedure for purposes other than remedying health state, unspecified: Secondary | ICD-10-CM | POA: Diagnosis not present

## 2023-12-04 DIAGNOSIS — F329 Major depressive disorder, single episode, unspecified: Secondary | ICD-10-CM | POA: Diagnosis not present

## 2024-01-04 DIAGNOSIS — Z419 Encounter for procedure for purposes other than remedying health state, unspecified: Secondary | ICD-10-CM | POA: Diagnosis not present

## 2024-01-15 ENCOUNTER — Ambulatory Visit: Attending: Obstetrics and Gynecology

## 2024-01-15 ENCOUNTER — Other Ambulatory Visit

## 2024-01-16 DIAGNOSIS — Z369 Encounter for antenatal screening, unspecified: Secondary | ICD-10-CM | POA: Diagnosis not present

## 2024-01-21 DIAGNOSIS — F329 Major depressive disorder, single episode, unspecified: Secondary | ICD-10-CM | POA: Diagnosis not present

## 2024-01-30 DIAGNOSIS — O99019 Anemia complicating pregnancy, unspecified trimester: Secondary | ICD-10-CM | POA: Diagnosis not present

## 2024-01-30 DIAGNOSIS — O99212 Obesity complicating pregnancy, second trimester: Secondary | ICD-10-CM | POA: Diagnosis not present

## 2024-01-30 DIAGNOSIS — E559 Vitamin D deficiency, unspecified: Secondary | ICD-10-CM | POA: Diagnosis not present

## 2024-01-30 DIAGNOSIS — Z0183 Encounter for blood typing: Secondary | ICD-10-CM | POA: Diagnosis not present

## 2024-01-30 DIAGNOSIS — E6689 Other obesity not elsewhere classified: Secondary | ICD-10-CM | POA: Diagnosis not present

## 2024-01-30 DIAGNOSIS — Z3A31 31 weeks gestation of pregnancy: Secondary | ICD-10-CM | POA: Diagnosis not present

## 2024-01-30 DIAGNOSIS — Z8759 Personal history of other complications of pregnancy, childbirth and the puerperium: Secondary | ICD-10-CM | POA: Diagnosis not present

## 2024-01-30 DIAGNOSIS — Z331 Pregnant state, incidental: Secondary | ICD-10-CM | POA: Diagnosis not present

## 2024-02-04 DIAGNOSIS — F329 Major depressive disorder, single episode, unspecified: Secondary | ICD-10-CM | POA: Diagnosis not present

## 2024-02-15 ENCOUNTER — Encounter (HOSPITAL_COMMUNITY): Payer: Self-pay | Admitting: Obstetrics and Gynecology

## 2024-02-15 ENCOUNTER — Inpatient Hospital Stay (HOSPITAL_COMMUNITY)

## 2024-02-15 ENCOUNTER — Inpatient Hospital Stay (HOSPITAL_COMMUNITY): Admission: AD | Admit: 2024-02-15 | Discharge: 2024-02-15 | Disposition: A | Discharge status: Home / Self Care

## 2024-02-15 DIAGNOSIS — O26893 Other specified pregnancy related conditions, third trimester: Secondary | ICD-10-CM | POA: Diagnosis not present

## 2024-02-15 DIAGNOSIS — W19XXXA Unspecified fall, initial encounter: Secondary | ICD-10-CM | POA: Insufficient documentation

## 2024-02-15 DIAGNOSIS — M899 Disorder of bone, unspecified: Secondary | ICD-10-CM | POA: Diagnosis not present

## 2024-02-15 DIAGNOSIS — M7732 Calcaneal spur, left foot: Secondary | ICD-10-CM | POA: Insufficient documentation

## 2024-02-15 DIAGNOSIS — S93402A Sprain of unspecified ligament of left ankle, initial encounter: Secondary | ICD-10-CM

## 2024-02-15 DIAGNOSIS — M25572 Pain in left ankle and joints of left foot: Secondary | ICD-10-CM | POA: Insufficient documentation

## 2024-02-15 DIAGNOSIS — Z3A33 33 weeks gestation of pregnancy: Secondary | ICD-10-CM | POA: Diagnosis not present

## 2024-02-15 NOTE — MAU Note (Addendum)
 Dana Solomon is a 28 y.o. at [redacted]w[redacted]d here in MAU reporting: fell this morning, was walking down a hill, as she fell - her left leg went back and she twisted her left ankle. Clemens forward hit pelvis.  Denies bleeding or loss of fluid. Reports +FM.  Onset of complaint: 0730 Pain score: ankle 8, pelvis 10 Vitals:   02/15/24 1049  BP: 139/79  Pulse: (!) 105  Resp: (!) 22  Temp: 98.4 F (36.9 C)  SpO2: 100%     FHT:140 Lab orders placed from triage:     Pt has 2 small children with her.  Visitor policy, 'no children' discussed.  Pt tearful, I don't have anyone down here.   Typical plan of care related to fall explained.  Again I don't have anyone.

## 2024-02-15 NOTE — Progress Notes (Signed)
 Orthopedic Tech Progress Note Patient Details:  Dana Solomon [birth year] [id number]  Ortho Devices Type of Ortho Device: CAM walker Ortho Device/Splint Location: LLE Ortho Device/Splint Interventions: Adjustment, Application, Ordered   Post Interventions Patient Tolerated: Well  [name] 02/15/2024, 2:07 PM

## 2024-02-15 NOTE — Progress Notes (Signed)
   02/15/24 1240  Spiritual Encounters  Type of Visit Initial  Care provided to: Patient  Referral source Clinical staff  Reason for visit Routine spiritual support  OnCall Visit No  Spiritual Framework  Presenting Themes Impactful experiences and emotions  Community/Connection None  Patient Stress Factors Health changes  Family Stress Factors None identified  Interventions  Spiritual Care Interventions Made Established relationship of care and support;Compassionate presence;Normalization of emotions  Intervention Outcomes  Outcomes Awareness of support;Reduced anxiety   Chaplain was paged by the medical team to provide presence and support for the Pt's two sons while the team attended to the Patients medical needs.  Chaplain sat with the Pt and her sons, offering a calm and supportive presence. Afterward, chaplain escorted the sons back to the Pt's room. Chaplain services remain available as needed.

## 2024-02-15 NOTE — MAU Note (Cosign Needed)
 Maternal Assessment Unit Provider Note  Subjective: [name] is a 28 y.o. [phone] pregnant female at [redacted]w[redacted]d who presents to MAU today with complaint of fall with left ankle sprain.   Patient fell a little bit before 8 AM.  She was walking down a hill and slipped on the grass.  She fell forward and hit her pelvis really hard and have to splits position with her left leg back and twisted her left ankle.  She reports possibly hitting her lower anterior abdomen as well.  At baseline she has had suprapubic/pelvic pain due to low fetal position during this pregnancy, and feels like the fall aggravated it.  She was able to put weight and walk on her left ankle though with a limp right after the event.  She denies vaginal bleeding, leaking fluid, no contractions, notes good fetal movement.  Receives care at [facility].   Pertinent items noted in HPI and remainder of comprehensive ROS otherwise negative.   Objective: BP 139/79 (BP Location: Right Arm)   Pulse (!) 105   Temp 98.4 F (36.9 C) (Oral)   Resp (!) 22   Ht 5' 4.5 (1.638 m)   Wt 132 kg   LMP 06/27/2023 (Exact Date)   SpO2 100%   BMI 49.16 kg/m  Physical Exam Vitals reviewed.  Constitutional:      General: She is not in acute distress.    Appearance: She is well-developed. She is not diaphoretic.  Eyes:     General: No scleral icterus. Cardiovascular:     Rate and Rhythm: Normal rate and regular rhythm.  Pulmonary:     Effort: Pulmonary effort is normal. No respiratory distress.     Breath sounds: Normal breath sounds.  Abdominal:     General: There is no distension.     Palpations: Abdomen is soft.     Tenderness: There is no abdominal tenderness. There is no guarding or rebound.  Musculoskeletal:     Comments: Left ankle: Pain in the high ankle region with palpation.  No medial malleolus tenderness but lateral malleolus tenderness is present.  She also notes pain in the Achilles region.  Pelvis: Tenderness to  palpation of pubic symphysis and bilateral rami.  No pelvic ring instability or pain with complete pression of pelvic ring.   Skin:    General: Skin is warm and dry.  Neurological:     Mental Status: She is alert.     Coordination: Coordination normal.     MDM: moderate risk  MAU Course: Patient was evaluated and determined to need left ankle x-ray due to failing [place] ankle rules with lateral malleolus tenderness to palpation.  Discussed pelvic fracture outside of high impact traumatic events is rare, so bony tenderness is more likely chronic with aggravation due to the fall versus bruising/bone bruise.  Patient reports she would not want an x-ray to evaluate even if it was recommended to avoid fetal radiation exposure.  Concern for fracture of the bony pelvis is not high enough to warrant the risk of radiation exposure at this time.  Patient declined acetaminophen  for pain control but did request a heating pad.  Ankle x-ray is negative for fracture.  Discussed risk of small fracture that is not detected along with ankle sprain, patient placed in cam walker boot, fitted by Ortho tech.   Given instructions and referral to wear the boot until follow-up with outpatient Ortho/sports medicine and repeat x-ray in 1 week.   FHT: baseline 140 bpm, moderate variability, +  accelerations, no decelerations,   Contractions: none    Questions were answered to the satisfaction of the patient and/or family prior to discharge.   Assessment Medical screening exam complete    ICD-10-CM   1. Sprain of left ankle, unspecified ligament, initial encounter  S93.402A     2. Fall, initial encounter  W19.XXXA     3. Pubic bone pain  M89.9        Plan -discharge in CAM walker boot -Ice, elevation, acetaminophen  for pain control -if weight bearing too painful even with boot use crutches -follow up with PCP in 1 week for repeat x-ray -FHT reactive/reassuring, no contractions -continue  supportive care for pelvic pain  Discharge from MAU in stable condition with strict  precautions Follow up at [facility] as scheduled for ongoing prenatal care  Allergies as of 02/15/2024   No Known Allergies      Medication List     STOP taking these medications    metroNIDAZOLE  500 MG tablet Commonly known as: FLAGYL        TAKE these medications    prenatal multivitamin Tabs tablet Take 1 tablet by mouth daily at 12 noon.        Trudy Leeroy NOVAK, MD 02/15/2024 2:15 PM

## 2024-02-15 NOTE — Discharge Instructions (Signed)

## 2024-02-27 LAB — OB RESULTS CONSOLE GBS: GBS: NEGATIVE

## 2024-03-05 DIAGNOSIS — Z419 Encounter for procedure for purposes other than remedying health state, unspecified: Secondary | ICD-10-CM | POA: Diagnosis not present

## 2024-03-10 DIAGNOSIS — Z3482 Encounter for supervision of other normal pregnancy, second trimester: Secondary | ICD-10-CM | POA: Diagnosis not present

## 2024-03-10 DIAGNOSIS — Z3483 Encounter for supervision of other normal pregnancy, third trimester: Secondary | ICD-10-CM | POA: Diagnosis not present

## 2024-03-11 ENCOUNTER — Other Ambulatory Visit: Payer: Self-pay | Admitting: Obstetrics and Gynecology

## 2024-03-12 DIAGNOSIS — Z364 Encounter for antenatal screening for fetal growth retardation: Secondary | ICD-10-CM | POA: Diagnosis not present

## 2024-03-12 DIAGNOSIS — Z3A37 37 weeks gestation of pregnancy: Secondary | ICD-10-CM | POA: Diagnosis not present

## 2024-04-01 ENCOUNTER — Encounter (HOSPITAL_COMMUNITY): Payer: Self-pay

## 2024-04-01 ENCOUNTER — Telehealth (HOSPITAL_COMMUNITY): Payer: Self-pay | Admitting: *Deleted

## 2024-04-01 NOTE — Telephone Encounter (Signed)
 Preadmission screen

## 2024-04-03 ENCOUNTER — Telehealth (HOSPITAL_COMMUNITY): Payer: Self-pay | Admitting: *Deleted

## 2024-04-03 NOTE — Telephone Encounter (Signed)
 Preadmission screen

## 2024-04-04 ENCOUNTER — Telehealth (HOSPITAL_COMMUNITY): Payer: Self-pay | Admitting: *Deleted

## 2024-04-04 ENCOUNTER — Encounter (HOSPITAL_COMMUNITY): Payer: Self-pay | Admitting: *Deleted

## 2024-04-04 DIAGNOSIS — Z419 Encounter for procedure for purposes other than remedying health state, unspecified: Secondary | ICD-10-CM | POA: Diagnosis not present

## 2024-04-04 NOTE — Telephone Encounter (Signed)
 Preadmission screen

## 2024-04-07 ENCOUNTER — Other Ambulatory Visit: Payer: Self-pay

## 2024-04-07 ENCOUNTER — Inpatient Hospital Stay (HOSPITAL_COMMUNITY)
Admission: AD | Admit: 2024-04-07 | Discharge: 2024-04-09 | Disposition: A | Source: Home / Self Care | Attending: Obstetrics and Gynecology | Admitting: Obstetrics and Gynecology

## 2024-04-07 ENCOUNTER — Encounter (HOSPITAL_COMMUNITY): Payer: Self-pay

## 2024-04-07 ENCOUNTER — Encounter (HOSPITAL_COMMUNITY): Payer: Self-pay | Admitting: Obstetrics and Gynecology

## 2024-04-07 ENCOUNTER — Inpatient Hospital Stay (EMERGENCY_DEPARTMENT_HOSPITAL)
Admission: AD | Admit: 2024-04-07 | Discharge: 2024-04-07 | Disposition: A | Discharge status: Home / Self Care | Source: Home / Self Care

## 2024-04-07 DIAGNOSIS — O9902 Anemia complicating childbirth: Secondary | ICD-10-CM | POA: Diagnosis not present

## 2024-04-07 DIAGNOSIS — O9832 Other infections with a predominantly sexual mode of transmission complicating childbirth: Secondary | ICD-10-CM | POA: Diagnosis present

## 2024-04-07 DIAGNOSIS — O368130 Decreased fetal movements, third trimester, not applicable or unspecified: Secondary | ICD-10-CM | POA: Diagnosis not present

## 2024-04-07 DIAGNOSIS — Z6791 Unspecified blood type, Rh negative: Secondary | ICD-10-CM

## 2024-04-07 DIAGNOSIS — Z8249 Family history of ischemic heart disease and other diseases of the circulatory system: Secondary | ICD-10-CM

## 2024-04-07 DIAGNOSIS — Z3A40 40 weeks gestation of pregnancy: Secondary | ICD-10-CM | POA: Diagnosis not present

## 2024-04-07 DIAGNOSIS — Z833 Family history of diabetes mellitus: Secondary | ICD-10-CM | POA: Diagnosis not present

## 2024-04-07 DIAGNOSIS — O99214 Obesity complicating childbirth: Secondary | ICD-10-CM | POA: Diagnosis not present

## 2024-04-07 DIAGNOSIS — O09299 Supervision of pregnancy with other poor reproductive or obstetric history, unspecified trimester: Secondary | ICD-10-CM

## 2024-04-07 DIAGNOSIS — O26893 Other specified pregnancy related conditions, third trimester: Secondary | ICD-10-CM | POA: Diagnosis not present

## 2024-04-07 DIAGNOSIS — O471 False labor at or after 37 completed weeks of gestation: Secondary | ICD-10-CM | POA: Insufficient documentation

## 2024-04-07 DIAGNOSIS — A6000 Herpesviral infection of urogenital system, unspecified: Secondary | ICD-10-CM | POA: Diagnosis present

## 2024-04-07 DIAGNOSIS — O479 False labor, unspecified: Secondary | ICD-10-CM

## 2024-04-07 DIAGNOSIS — O99210 Obesity complicating pregnancy, unspecified trimester: Secondary | ICD-10-CM | POA: Diagnosis present

## 2024-04-07 LAB — COMPREHENSIVE METABOLIC PANEL WITH GFR
ALT: 13 U/L (ref 0–44)
AST: 22 U/L (ref 15–41)
Albumin: 2.7 g/dL — ABNORMAL LOW (ref 3.5–5.0)
Alkaline Phosphatase: 189 U/L — ABNORMAL HIGH (ref 38–126)
Anion gap: 13 (ref 5–15)
BUN: 7 mg/dL (ref 6–20)
CO2: 17 mmol/L — ABNORMAL LOW (ref 22–32)
Calcium: 9.2 mg/dL (ref 8.9–10.3)
Chloride: 106 mmol/L (ref 98–111)
Creatinine, Ser: 0.61 mg/dL (ref 0.44–1.00)
GFR, Estimated: >60 mL/min (ref >60)
Glucose, Bld: 113 mg/dL — ABNORMAL HIGH (ref 70–99)
Potassium: 3.6 mmol/L (ref 3.5–5.1)
Sodium: 136 mmol/L (ref 135–145)
Total Bilirubin: 0.8 mg/dL (ref 0.0–1.2)
Total Protein: 6.6 g/dL (ref 6.5–8.1)

## 2024-04-07 LAB — CBC
HCT: 32.2 % — ABNORMAL LOW (ref 36.0–46.0)
Hemoglobin: 9.5 g/dL — ABNORMAL LOW (ref 12.0–15.0)
MCH: 20.9 pg — ABNORMAL LOW (ref 26.0–34.0)
MCHC: 29.5 g/dL — ABNORMAL LOW (ref 30.0–36.0)
MCV: 70.9 fL — ABNORMAL LOW (ref 80.0–100.0)
Platelets: 293 K/uL (ref 150–400)
RBC: 4.54 MIL/uL (ref 3.87–5.11)
RDW: 18.1 % — ABNORMAL HIGH (ref 11.5–15.5)
WBC: 10.7 K/uL — ABNORMAL HIGH (ref 4.0–10.5)
nRBC: 0 % (ref 0.0–0.2)

## 2024-04-07 LAB — RUPTURE OF MEMBRANE (ROM)PLUS: Rom Plus: NEGATIVE

## 2024-04-07 LAB — TYPE AND SCREEN
ABO/RH(D): O NEG
Antibody Screen: NEGATIVE

## 2024-04-07 LAB — POCT FERN TEST
POCT Fern Test: NEGATIVE
POCT Fern Test: POSITIVE

## 2024-04-07 MED ORDER — BENZOCAINE-MENTHOL 20-0.5 % EX AERO: 1.0000 | INHALATION_SPRAY | CUTANEOUS | Status: DC | PRN

## 2024-04-07 MED ORDER — IBUPROFEN 600 MG PO TABS
600.0000 mg | ORAL_TABLET | Freq: Four times a day (QID) | ORAL | Status: DC
  Filled 2024-04-07 (×3): qty 1

## 2024-04-07 MED ORDER — DIPHENHYDRAMINE HCL 25 MG PO CAPS: 25.0000 mg | ORAL_CAPSULE | Freq: Four times a day (QID) | ORAL | Status: DC | PRN

## 2024-04-07 MED ORDER — ACETAMINOPHEN 325 MG PO TABS: 650.0000 mg | ORAL_TABLET | ORAL | Status: DC | PRN

## 2024-04-07 MED ORDER — ONDANSETRON HCL 4 MG PO TABS: 4.0000 mg | ORAL_TABLET | ORAL | Status: DC | PRN

## 2024-04-07 MED ORDER — SENNOSIDES-DOCUSATE SODIUM 8.6-50 MG PO TABS
2.0000 | ORAL_TABLET | Freq: Every day | ORAL | Status: DC
  Filled 2024-04-07: qty 2

## 2024-04-07 MED ORDER — OXYCODONE-ACETAMINOPHEN 5-325 MG PO TABS: 2.0000 | ORAL_TABLET | ORAL | Status: DC | PRN

## 2024-04-07 MED ORDER — TETANUS-DIPHTH-ACELL PERTUSSIS 5-2-15.5 LF-MCG/0.5 IM SUSP
0.5000 mL | Freq: Once | INTRAMUSCULAR | Status: DC
  Filled 2024-04-07: qty 0.5

## 2024-04-07 MED ORDER — WITCH HAZEL-GLYCERIN EX PADS: 1.0000 | MEDICATED_PAD | CUTANEOUS | Status: DC | PRN

## 2024-04-07 MED ORDER — LACTATED RINGERS IV SOLN: INTRAVENOUS | Status: DC

## 2024-04-07 MED ORDER — SOD CITRATE-CITRIC ACID 500-334 MG/5ML PO SOLN: 30.0000 mL | ORAL | Status: DC | PRN

## 2024-04-07 MED ORDER — ONDANSETRON HCL 4 MG/2ML IJ SOLN: 4.0000 mg | Freq: Four times a day (QID) | INTRAMUSCULAR | Status: DC | PRN

## 2024-04-07 MED ORDER — FENTANYL CITRATE (PF) 100 MCG/2ML IJ SOLN
100.0000 ug | Freq: Once | INTRAMUSCULAR | Status: AC
  Administered 2024-04-07: 16:00:00 100 ug via INTRAVENOUS
  Filled 2024-04-07: qty 2

## 2024-04-07 MED ORDER — OXYTOCIN BOLUS FROM INFUSION: 333.0000 mL | Freq: Once | INTRAVENOUS | Status: AC

## 2024-04-07 MED ORDER — ZOLPIDEM TARTRATE 5 MG PO TABS: 5.0000 mg | ORAL_TABLET | Freq: Every evening | ORAL | Status: DC | PRN

## 2024-04-07 MED ORDER — COCONUT OIL OIL: 1.0000 | TOPICAL_OIL | Status: DC | PRN

## 2024-04-07 MED ORDER — LACTATED RINGERS IV SOLN: 500.0000 mL | INTRAVENOUS | Status: DC | PRN

## 2024-04-07 MED ORDER — PRENATAL MULTIVITAMIN CH
1.0000 | ORAL_TABLET | Freq: Every day | ORAL | Status: DC
  Filled 2024-04-07: qty 1

## 2024-04-07 MED ORDER — FLEET ENEMA RE ENEM: 1.0000 | ENEMA | RECTAL | Status: DC | PRN

## 2024-04-07 MED ORDER — OXYTOCIN-SODIUM CHLORIDE 30-0.9 UT/500ML-% IV SOLN
2.5000 [IU]/h | INTRAVENOUS | Status: DC
  Administered 2024-04-07: 17:00:00 2.5 [IU]/h via INTRAVENOUS

## 2024-04-07 MED ORDER — SIMETHICONE 80 MG PO CHEW: 80.0000 mg | CHEWABLE_TABLET | ORAL | Status: DC | PRN

## 2024-04-07 MED ORDER — ONDANSETRON HCL 4 MG/2ML IJ SOLN: 4.0000 mg | INTRAMUSCULAR | Status: DC | PRN

## 2024-04-07 MED ORDER — DIBUCAINE (PERIANAL) 1 % EX OINT: 1.0000 | TOPICAL_OINTMENT | CUTANEOUS | Status: DC | PRN

## 2024-04-07 MED ORDER — NIFEDIPINE ER OSMOTIC RELEASE 30 MG PO TB24
30.0000 mg | ORAL_TABLET | Freq: Every day | ORAL | Status: DC
  Administered 2024-04-07: 18:00:00 30 mg via ORAL
  Filled 2024-04-07: qty 1

## 2024-04-07 MED ORDER — LIDOCAINE HCL (PF) 1 % IJ SOLN: 30.0000 mL | INTRAMUSCULAR | Status: DC | PRN

## 2024-04-07 MED ORDER — OXYCODONE-ACETAMINOPHEN 5-325 MG PO TABS: 1.0000 | ORAL_TABLET | ORAL | Status: DC | PRN

## 2024-04-07 MED ORDER — OXYTOCIN-SODIUM CHLORIDE 30-0.9 UT/500ML-% IV SOLN
INTRAVENOUS | Status: AC
  Administered 2024-04-07: 16:00:00 333 mL via INTRAVENOUS
  Filled 2024-04-07: qty 500

## 2024-04-07 NOTE — Assessment & Plan Note (Deleted)
 Routine Newborn Care. Lactation has met with Mother/newborn and has feeding plan in place. Newborn has passed CHD and hearing screen; newborn metabolic screen collected.

## 2024-04-07 NOTE — H&P (Signed)
 OB ADMISSION/ HISTORY & PHYSICAL:  Admission Date: 04/07/2024  2:40 PM  Admit Diagnosis: Normal labor [O80, Z37.9]    Dana Solomon is a 28 y.o. female [phone] at [redacted]w[redacted]d presenting for active labor. States SROM at 1400 and contractions started thereafter. Denies vaginal bleeding. Endorses + fetal movement. Partner, Dana Solomon, present and supportive. Eagerly anticipating a baby boy Dana Solomon.   Prenatal History: [id number]   EDC: 04/02/2024, by Last Menstrual Period   Prenatal course complicated by: History of HSV, on prophylactic Valtrex at 36 weeks History of PEC with G1, IOL at 36 weeks, gHTN with G2 Rh negative, fetus Rh positive via Panorama, s/p Rhogam at 28 weeks BMI 48 at NOB, reassuring ANFTing Anemia, on PO iron   Prenatal Labs: ABO, Rh:   O NEG Antibody: NEG (12/15 1552) Rubella: Immune (04/29 0000)  RPR: NON REACTIVE (04/02 1929)  HBsAg: Negative (04/29 0000)  HIV: Non-reactive (04/29 0000)  GBS: Negative/-- (11/05 0000)  1 hr Glucola : 114  Genetic Screening: Low risk Panorama XY Ultrasound: normal XY anatomy, posterior placenta  Prenatal Transfer Tool  Maternal Diabetes: No Genetic Screening: Normal Maternal Ultrasounds/Referrals: Normal Fetal Ultrasounds or other Referrals:  None Maternal Substance Abuse:  No Significant Maternal Medications:  None Significant Maternal Lab Results: Group B Strep negative Number of Prenatal Visits:greater than 3 verified prenatal visits Maternal Vaccinations: Declines Other Comments:  None   Medical / Surgical History : Past medical history:  Past Medical History:  Diagnosis Date   Acute blood loss anemia 03/19/2020   Gestational diabetes    first pregnancy   Gestational hypertension 03/19/2020   History of pre-eclampsia in prior pregnancy, currently pregnant    Hypertension    Medical history non-contributory    Postpartum care following vaginal delivery 11/25 03/18/2020   Pregnancy induced hypertension    SVD (spontaneous  vaginal delivery) 11/25 03/18/2020   Vaginal delivery 10/01/2021    Past surgical history:  Past Surgical History:  Procedure Laterality Date   Belly button      approx 10 years ago   [employer]     age 71y    Family History:  Family History  Problem Relation Age of Onset   Healthy Mother    Diabetes Father    Hypertension Father    Asthma Sister     Social History:  reports that she has never smoked. She has never used smokeless tobacco. She reports that she does not currently use alcohol. She reports that she does not use drugs.  Allergies: Patient has no known allergies.   Current Medications at time of admission:  Medications Prior to Admission  Medication Sig Dispense Refill Last Dose/Taking   Prenatal Vit-Fe Fumarate-FA (PRENATAL MULTIVITAMIN) TABS tablet Take 1 tablet by mouth daily at 12 noon.   04/07/2024    Review of Systems: Review of Systems  All other systems reviewed and are negative.  Physical Exam: Vital signs and nursing notes reviewed.  Patient Vitals for the past 24 hrs:  BP Temp Temp src Pulse Resp SpO2  04/07/24 1715 (!) 153/79 -- -- (!) 103 18 --  04/07/24 1700 (!) 148/86 98.6 F (37 C) Oral (!) 106 18 --  04/07/24 1650 (!) 163/76 -- -- (!) 108 -- --  04/07/24 1640 (!) 151/75 -- -- (!) 117 18 --  04/07/24 1608 -- -- -- -- -- 100 %  04/07/24 1603 -- -- -- -- -- 100 %  04/07/24 1549 -- -- -- (!) 111 -- --  04/07/24 1533 -- -- -- -- --  100 %  04/07/24 1500 (!) 133/91 98.3 F (36.8 C) Oral (!) 117 18 99 %    General: AAO x 3, NAD Heart: RRR Lungs:CTAB Abdomen: Gravid, NT Extremities: 1+ edema SVE: Dilation: 10 Dilation Complete Date: 04/07/24 Dilation Complete Time: 1624 Effacement (%): 70 Station: Plus 3 Presentation: Vertex Exam by:: A Dana Solomon CNM   FHR: 145BPM, moderate variability, + accels, no decels TOCO: Contractions q 2-3 minutes  Labs:   Recent Labs    04/07/24 1552  WBC 10.7*  HGB 9.5*  HCT 32.2*  PLT 293    Assessment/Plan: 28 y.o. [id number] at [redacted]w[redacted]d, active labor, SROM with meconium stained fluid BMI >40, reassuring ANFTing History of HSV, on Valtrex History of PEC with G1, taking bbASA Rh negative Anemia, on PO iron , admission Hgb 9.5  Elevated BPs on admission, add CMP and PCR to admission labs, denies PEC symptoms  Fetal wellbeing - FHT category 1 EFW AGA 7-8lbs  Labor: Precipitous delivery upon arrival to L&D  GBS negative Rubella immune Rh negative, s/p Rhogam at 28 weeks  Plans to breastfeed.  POC discussed with patient and support team, all questions answered.  Dr. Armond notified of admission/plan of care.  Dana Solomon CNM, MSN 04/07/2024, 5:30 PM

## 2024-04-07 NOTE — Discharge Instructions (Signed)

## 2024-04-07 NOTE — Plan of Care (Signed)
   Problem: Education: Goal: Knowledge of General Education information will improve Description: Including pain rating scale, medication(s)/side effects and non-pharmacologic comfort measures Outcome: Progressing   Problem: Health Behavior/Discharge Planning: Goal: Ability to manage health-related needs will improve Outcome: Progressing   Problem: Clinical Measurements: Goal: Ability to maintain clinical measurements within normal limits will improve Outcome: Progressing Goal: Will remain free from infection Outcome: Progressing Goal: Diagnostic test results will improve Outcome: Progressing Goal: Respiratory complications will improve Outcome: Progressing Goal: Cardiovascular complication will be avoided Outcome: Progressing   Problem: Activity: Goal: Risk for activity intolerance will decrease Outcome: Progressing   Problem: Nutrition: Goal: Adequate nutrition will be maintained Outcome: Progressing   Problem: Coping: Goal: Level of anxiety will decrease Outcome: Progressing   Problem: Elimination: Goal: Will not experience complications related to bowel motility Outcome: Progressing Goal: Will not experience complications related to urinary retention Outcome: Progressing   Problem: Pain Managment: Goal: General experience of comfort will improve and/or be controlled Outcome: Progressing   Problem: Safety: Goal: Ability to remain free from injury will improve Outcome: Progressing   Problem: Skin Integrity: Goal: Risk for impaired skin integrity will decrease Outcome: Progressing   Problem: Education: Goal: Knowledge of condition will improve Outcome: Progressing Goal: Individualized Educational Video(s) Outcome: Progressing Goal: Individualized Newborn Educational Video(s) Outcome: Progressing   Problem: Activity: Goal: Will verbalize the importance of balancing activity with adequate rest periods Outcome: Progressing Goal: Ability to tolerate increased  activity will improve Outcome: Progressing   Problem: Coping: Goal: Ability to identify and utilize available resources and services will improve Outcome: Progressing   Problem: Life Cycle: Goal: Chance of risk for complications during the postpartum period will decrease Outcome: Progressing   Problem: Role Relationship: Goal: Ability to demonstrate positive interaction with newborn will improve Outcome: Progressing   Problem: Skin Integrity: Goal: Demonstration of wound healing without infection will improve Outcome: Progressing   Problem: Education: Goal: Knowledge of condition will improve Outcome: Progressing Goal: Individualized Educational Video(s) Outcome: Progressing Goal: Individualized Newborn Educational Video(s) Outcome: Progressing   Problem: Activity: Goal: Will verbalize the importance of balancing activity with adequate rest periods Outcome: Progressing Goal: Ability to tolerate increased activity will improve Outcome: Progressing   Problem: Coping: Goal: Ability to identify and utilize available resources and services will improve Outcome: Progressing   Problem: Life Cycle: Goal: Chance of risk for complications during the postpartum period will decrease Outcome: Progressing   Problem: Role Relationship: Goal: Ability to demonstrate positive interaction with newborn will improve Outcome: Progressing   Problem: Skin Integrity: Goal: Demonstration of wound healing without infection will improve Outcome: Progressing

## 2024-04-07 NOTE — MAU Note (Cosign Needed)
 Maternal Assessment Unit Provider Note  Subjective: [name] is a 28 y.o. [phone] pregnant female at [redacted]w[redacted]d who presents to MAU today with complaint of contractions and possible SROM.   Patient presents for a labor and SROM check. She is contracting infrequently. See RN triage note for additional details.  Receives care at [facility].  Pertinent items noted in HPI and remainder of comprehensive ROS otherwise negative.   Objective: BP 133/82 (BP Location: Right Arm)   Pulse 86   Temp 98 F (36.7 C) (Oral)   Resp 20   Ht 5' 4 (1.626 m)   Wt (!) 136.7 kg   LMP 06/27/2023 (Exact Date)   SpO2 100%   BMI 51.74 kg/m   MDM: low risk  MAU Course:  Time: 2:15 AM   FHT: baseline 140 bpm, moderate variability, + accelerations, no decelerations,   Contractions: irregular, q6-8 mins  Questions were answered to the satisfaction of the patient and/or family prior to discharge.   Assessment Medical screening exam complete    ICD-10-CM   1. [redacted] weeks gestation of pregnancy  Z3A.40     2. False labor  O47.9        Plan Discharge from MAU in stable condition with strict labor precautions  Follow up at [facility] as scheduled for ongoing prenatal care  Allergies as of 04/07/2024   No Known Allergies      Medication List     TAKE these medications    prenatal multivitamin Tabs tablet Take 1 tablet by mouth daily at 12 noon.        Trudy Leeroy NOVAK, MD 04/07/2024 2:14 AM

## 2024-04-07 NOTE — Progress Notes (Signed)
 RN attempting to recheck BP, pt uncomfortable with ctxs and moving about in bed. Unable to obtain BP reading

## 2024-04-07 NOTE — Progress Notes (Signed)
 Attempted to again obtain BP, unable to do so.  Pt continues to move about the bed, BP monitor won't read.

## 2024-04-07 NOTE — Progress Notes (Signed)
 RONAL Molt, CNM notified by S. Volanda Potters, CNM

## 2024-04-07 NOTE — MAU Note (Signed)
 Dana Solomon is a 28 y.o. at [redacted]w[redacted]d here in MAU reporting: her water broke about 30 minutes ago, ctxs started right afterwards.  Denies VB.  Reports +FM.  LMP: 06/27/2023 Onset of complaint: today Pain score: 9 Vitals:   04/07/24 1500  BP: (!) 133/91  Pulse: (!) 117  Resp: 18  Temp: 98.3 F (36.8 C)  SpO2: 99%     FHT: 155 bpm  Lab orders placed from triage: None

## 2024-04-07 NOTE — MAU Note (Signed)
 MAU Triage Note: Dana Solomon is a 28 y.o. at [redacted]w[redacted]d here in MAU reporting: possible ROM around 1900 after having a gush of fluid while sitting on the toilet and voiding. Has not continued to leak since, but did have another gush of fluid while voiding around 2200. Reporting contractions every 5-7 minutes apart. Not planning for epidural. Reports +FM.  Patient complaint: water broke ctx  Pain Score: 8  Pain Location: Abdomen     Onset of complaint: today LMP: Patient's last menstrual period was 06/27/2023 (exact date).  Vitals:   04/07/24 0043  BP: 133/82  Pulse: 86  Resp: 20  Temp: 98 F (36.7 C)  SpO2: 100%    FHT:   Deferred due to maternal dress; patient taken directly from triage to room Lab orders placed from triage: MAU labor

## 2024-04-08 ENCOUNTER — Inpatient Hospital Stay (HOSPITAL_COMMUNITY)

## 2024-04-08 LAB — CBC
HCT: 27.9 % — ABNORMAL LOW (ref 36.0–46.0)
Hemoglobin: 8.7 g/dL — ABNORMAL LOW (ref 12.0–15.0)
MCH: 21.3 pg — ABNORMAL LOW (ref 26.0–34.0)
MCHC: 31.2 g/dL (ref 30.0–36.0)
MCV: 68.4 fL — ABNORMAL LOW (ref 80.0–100.0)
Platelets: 314 K/uL (ref 150–400)
RBC: 4.08 MIL/uL (ref 3.87–5.11)
RDW: 17.7 % — ABNORMAL HIGH (ref 11.5–15.5)
WBC: 16.6 K/uL — ABNORMAL HIGH (ref 4.0–10.5)
nRBC: 0 % (ref 0.0–0.2)

## 2024-04-08 LAB — SYPHILIS: RPR W/REFLEX TO RPR TITER AND TREPONEMAL ANTIBODIES, TRADITIONAL SCREENING AND DIAGNOSIS ALGORITHM: RPR Ser Ql: NONREACTIVE

## 2024-04-08 MED ORDER — RHO D IMMUNE GLOBULIN 1500 UNIT/2ML IJ SOSY
300.0000 ug | PREFILLED_SYRINGE | Freq: Once | INTRAMUSCULAR | Status: AC
  Administered 2024-04-08: 12:00:00 300 ug via INTRAVENOUS
  Filled 2024-04-08: qty 2

## 2024-04-08 MED ORDER — NIFEDIPINE ER OSMOTIC RELEASE 30 MG PO TB24
30.0000 mg | ORAL_TABLET | Freq: Every day | ORAL | Status: DC
  Administered 2024-04-08: 17:00:00 30 mg via ORAL
  Filled 2024-04-08: qty 1

## 2024-04-08 NOTE — Discharge Summary (Incomplete)
 Postpartum Discharge Summary  Date of Service updated12/17/25     Patient Name: Dana Solomon DOB: 1995/07/20 MRN: [id number]  Date of admission: 04/07/2024 Delivery date:04/07/2024 Delivering provider: JOSHUA PALMA K Date of discharge: 04/09/2024  Admitting diagnosis: Normal labor [O80, Z37.9] Intrauterine pregnancy: [redacted]w[redacted]d     Secondary diagnosis:  Principal Problem:   Postpartum care following vaginal delivery 12/15 Active Problems:   Hx of pre-eclampsia in prior pregnancy, currently pregnant   Maternal obesity affecting pregnancy, antepartum   Normal labor   SVD (spontaneous vaginal delivery)  Additional problems: None    Discharge diagnosis: Term Pregnancy Delivered                                              Post partum procedures:None Augmentation: N/A Complications: None  Hospital course: Onset of Labor With Vaginal Delivery      28 y.o. yo [id number] at [redacted]w[redacted]d was admitted in Active Labor on 04/07/2024. Labor course was uncomplicated. Admitted in active labor at 8 cm and delivered within 1 hour of arrival to the hospital.   Membrane Rupture Time/Date: 2:20 PM,04/07/2024  Delivery Method:Vaginal, Spontaneous Operative Delivery:N/A Episiotomy: None Lacerations:  None Patient had a postpartum course was uncomplicated.  She is ambulating, tolerating a regular diet, passing flatus, and urinating well. Patient is discharged home in stable condition on 04/08/2024.  Newborn Data: Birth date:04/07/2024 Birth time:4:24 PM Gender:Female Living status:Living Apgars:8 ,9  Weight:4130 g  Magnesium  Sulfate received: No BMZ received: No Rhophylac :N/A MMR:N/A Transfusion:No Immunizations administered: Immunization History  Administered Date(s) Administered   PPD Test 06/09/2020   Rho (D) Immune Globulin  07/16/2012, 09/21/2012   Tdap 08/11/2021    Physical exam  Vitals:   04/08/24 0342 04/08/24 1506 04/08/24 2040 04/09/24 0515  BP: 124/74 136/78 119/80 123/68   Pulse: (!) 105 (!) 112 (!) 110 99  Resp: 18 18 18 18   Temp: 98 F (36.7 C) 98.3 F (36.8 C) 98.5 F (36.9 C) 98.3 F (36.8 C)  TempSrc: Oral Oral Oral Oral  SpO2: 100% 100% 99% 99%  Weight:      Height:       General: alert, cooperative, and no distress Lochia: appropriate Uterine Fundus: firm Incision: N/A DVT Evaluation: No evidence of DVT seen on physical exam. No cords or calf tenderness. No significant calf/ankle edema. Labs: Lab Results  Component Value Date   WBC 16.6 (H) 04/08/2024   HGB 8.7 (L) 04/08/2024   HCT 27.9 (L) 04/08/2024   MCV 68.4 (L) 04/08/2024   PLT 314 04/08/2024      Latest Ref Rng & Units 04/07/2024    3:52 PM  CMP  Glucose 70 - 99 mg/dL 886   BUN 6 - 20 mg/dL 7   Creatinine 9.55 - 8.99 mg/dL 9.38   Sodium 864 - 854 mmol/L 136   Potassium 3.5 - 5.1 mmol/L 3.6   Chloride 98 - 111 mmol/L 106   CO2 22 - 32 mmol/L 17   Calcium 8.9 - 10.3 mg/dL 9.2   Total Protein 6.5 - 8.1 g/dL 6.6   Total Bilirubin 0.0 - 1.2 mg/dL 0.8   Alkaline Phos 38 - 126 U/L 189   AST 15 - 41 U/L 22   ALT 0 - 44 U/L 13    Edinburgh Score:    04/08/2024    7:45 AM  Edinburgh Postnatal Depression Scale  Screening Tool  I have been able to laugh and see the funny side of things. 0  I have looked forward with enjoyment to things. 0  I have blamed myself unnecessarily when things went wrong. 1  I have been anxious or worried for no good reason. 0  I have felt scared or panicky for no good reason. 1  Things have been getting on top of me. 1  I have been so unhappy that I have had difficulty sleeping. 1  I have felt sad or miserable. 1  I have been so unhappy that I have been crying. 1  The thought of harming myself has occurred to me. 0  Edinburgh Postnatal Depression Scale Total 6      After visit meds:  Allergies as of 04/09/2024   No Known Allergies      Medication List     TAKE these medications    acetaminophen  325 MG tablet Commonly known as:  Tylenol  Take 2 tablets (650 mg total) by mouth every 6 (six) hours.   ibuprofen  600 MG tablet Commonly known as: ADVIL  Take 1 tablet (600 mg total) by mouth every 6 (six) hours.   NIFEdipine  30 MG 24 hr tablet Commonly known as: ADALAT  CC Take 1 tablet (30 mg total) by mouth daily.   prenatal multivitamin Tabs tablet Take 1 tablet by mouth daily at 12 noon.   valACYclovir 500 MG tablet Commonly known as: VALTREX Take 500 mg by mouth 2 (two) times daily. Started at 36 weeks for prophylaxis         Discharge home in stable condition Infant Feeding: Breast and supplementing with donor milk to expedite bilirubin excretion  Infant Disposition:home with mother Discharge instruction: per After Visit Summary and Postpartum booklet. Activity: Advance as tolerated. Pelvic rest for 6 weeks.  Diet: routine diet Anticipated Birth Control: natural family planning Postpartum Appointment:6 weeks Additional Postpartum F/U: BP check 1 week Future Appointments:No future appointments. Follow up Visit:  Follow-up Information     [facility]. Schedule an appointment as soon as possible for a visit in 1 week(s).   Specialty: Obstetrics and Gynecology Why: Return to [facility] in 1 week for a blood pressure check and then again in 6 weeks for your postpartum visit. Contact information: [address] [place]  [address] [phone]                    04/08/2024 Dana Solomon, CNM

## 2024-04-08 NOTE — Lactation Note (Signed)
 This note was copied from a baby's chart. Lactation Consultation Note  Patient Name: Dana Solomon Date: 04/08/2024 Age:30 hours  Reason for consult: Initial assessment;Term  P4, [redacted]w[redacted]d, 3% weight loss  Initial LC visit to see P4 mother and her baby Dana. Mother reports baby has latched well and she has breastfeeding experience. Per Mother, baby was born fast and he has been spitting up clear fluids.   Mother encouraged to latch baby with feeding cues, place baby skin to skin if not latching, and call for assistance with breastfeeding as needed. Informed mother regarding cluster feeding as baby approaches 24 hours of age. Baby should want to increase feeding frequency, often breastfeeding 8-12 times in 24 hours and clustering his feedings.    Mother denies any concerns or questions. She feels baby will breast feed well after he gets the amniotic fluid cleared. She can hand express colostrum. She has a breast pump, when needed. Mother states she is hopeful that she and baby will be discharged this afternoon.   Mom made aware of O/P services, breastfeeding support groups, community resources, and  phone # for post-discharge questions.       Maternal Data Has patient been taught Hand Expression?: No Does the patient have breastfeeding experience prior to this delivery?: Yes How long did the patient breastfeed?: 3 months to 1 year, breast fed all of her children  Feeding Mother's Current Feeding Choice: Breast Milk   Interventions Interventions: Breast feeding basics reviewed;Education;LC Services brochure;CDC milk storage guidelines;CDC Guidelines for Breast Pump Cleaning  Discharge Discharge Education: Engorgement and breast care;Warning signs for feeding baby Pump: Personal;Hands Free  Consult Status Consult Status: Complete    Joshua Rojelio HERO 04/08/2024, 8:31 AM

## 2024-04-08 NOTE — Plan of Care (Signed)
 Problem: Education: Goal: Knowledge of General Education information will improve Description: Including pain rating scale, medication(s)/side effects and non-pharmacologic comfort measures 04/08/2024 0800 by Madison Rosina LABOR, LPN Outcome: Progressing 04/07/2024 1854 by Madison Rosina LABOR, LPN Outcome: Progressing   Problem: Health Behavior/Discharge Planning: Goal: Ability to manage health-related needs will improve 04/08/2024 0800 by Madison Rosina LABOR, LPN Outcome: Progressing 04/07/2024 1854 by Madison Rosina LABOR, LPN Outcome: Progressing   Problem: Clinical Measurements: Goal: Ability to maintain clinical measurements within normal limits will improve 04/08/2024 0800 by Madison Rosina LABOR, LPN Outcome: Progressing 04/07/2024 1854 by Madison Rosina LABOR, LPN Outcome: Progressing Goal: Will remain free from infection 04/08/2024 0800 by Madison Rosina LABOR, LPN Outcome: Progressing 04/07/2024 1854 by Madison Rosina LABOR, LPN Outcome: Progressing Goal: Diagnostic test results will improve 04/08/2024 0800 by Madison Rosina LABOR, LPN Outcome: Progressing 04/07/2024 1854 by Madison Rosina LABOR, LPN Outcome: Progressing Goal: Respiratory complications will improve 04/08/2024 0800 by Madison Rosina LABOR, LPN Outcome: Progressing 04/07/2024 1854 by Madison Rosina LABOR, LPN Outcome: Progressing Goal: Cardiovascular complication will be avoided 04/08/2024 0800 by Madison Rosina LABOR, LPN Outcome: Progressing 04/07/2024 1854 by Madison Rosina LABOR, LPN Outcome: Progressing   Problem: Activity: Goal: Risk for activity intolerance will decrease 04/08/2024 0800 by Madison Rosina LABOR, LPN Outcome: Progressing 04/07/2024 1854 by Madison Rosina LABOR, LPN Outcome: Progressing   Problem: Nutrition: Goal: Adequate nutrition will be maintained 04/08/2024 0800 by Madison Rosina LABOR, LPN Outcome: Progressing 04/07/2024 1854 by Madison Rosina LABOR, LPN Outcome: Progressing   Problem: Coping: Goal: Level of anxiety will decrease 04/08/2024 0800 by Madison Rosina LABOR, LPN Outcome: Progressing 04/07/2024 1854 by Madison Rosina LABOR, LPN Outcome: Progressing   Problem: Elimination: Goal: Will not experience complications related to bowel motility 04/08/2024 0800 by Madison Rosina LABOR, LPN Outcome: Progressing 04/07/2024 1854 by Madison Rosina LABOR, LPN Outcome: Progressing Goal: Will not experience complications related to urinary retention 04/08/2024 0800 by Madison Rosina LABOR, LPN Outcome: Progressing 04/07/2024 1854 by Madison Rosina LABOR, LPN Outcome: Progressing   Problem: Pain Managment: Goal: General experience of comfort will improve and/or be controlled 04/08/2024 0800 by Madison Rosina LABOR, LPN Outcome: Progressing 04/07/2024 1854 by Madison Rosina LABOR, LPN Outcome: Progressing   Problem: Safety: Goal: Ability to remain free from injury will improve 04/08/2024 0800 by Madison Rosina LABOR, LPN Outcome: Progressing 04/07/2024 1854 by Madison Rosina LABOR, LPN Outcome: Progressing   Problem: Skin Integrity: Goal: Risk for impaired skin integrity will decrease 04/08/2024 0800 by Madison Rosina LABOR, LPN Outcome: Progressing 04/07/2024 1854 by Madison Rosina LABOR, LPN Outcome: Progressing   Problem: Education: Goal: Knowledge of condition will improve 04/08/2024 0800 by Madison Rosina LABOR, LPN Outcome: Progressing 04/07/2024 1854 by Madison Rosina LABOR, LPN Outcome: Progressing Goal: Individualized Educational Video(s) 04/08/2024 0800 by Madison Rosina LABOR, LPN Outcome: Progressing 04/07/2024 1854 by Madison Rosina LABOR, LPN Outcome: Progressing Goal: Individualized Newborn Educational Video(s) 04/08/2024 0800 by Madison Rosina LABOR, LPN Outcome: Progressing 04/07/2024 1854 by Madison Rosina LABOR, LPN Outcome: Progressing   Problem: Activity: Goal: Will verbalize the importance of balancing activity with adequate rest periods 04/08/2024 0800 by Madison Rosina LABOR, LPN Outcome: Progressing 04/07/2024 1854 by Madison Rosina LABOR, LPN Outcome: Progressing Goal: Ability to tolerate increased  activity will improve 04/08/2024 0800 by Madison Rosina LABOR, LPN Outcome: Progressing 04/07/2024 1854 by Madison Rosina LABOR, LPN Outcome: Progressing   Problem: Coping: Goal: Ability to identify and utilize available resources and services will improve 04/08/2024 0800 by Madison Rosina LABOR, LPN Outcome: Progressing 04/07/2024 1854  by Madison Rosina LABOR, LPN Outcome: Progressing   Problem: Life Cycle: Goal: Chance of risk for complications during the postpartum period will decrease 04/08/2024 0800 by Madison Rosina LABOR, LPN Outcome: Progressing 04/07/2024 1854 by Madison Rosina LABOR, LPN Outcome: Progressing   Problem: Role Relationship: Goal: Ability to demonstrate positive interaction with newborn will improve 04/08/2024 0800 by Madison Rosina LABOR, LPN Outcome: Progressing 04/07/2024 1854 by Madison Rosina LABOR, LPN Outcome: Progressing   Problem: Skin Integrity: Goal: Demonstration of wound healing without infection will improve 04/08/2024 0800 by Madison Rosina LABOR, LPN Outcome: Progressing 04/07/2024 1854 by Madison Rosina LABOR, LPN Outcome: Progressing   Problem: Education: Goal: Knowledge of condition will improve 04/08/2024 0800 by Madison Rosina LABOR, LPN Outcome: Progressing 04/07/2024 1854 by Madison Rosina LABOR, LPN Outcome: Progressing Goal: Individualized Educational Video(s) 04/08/2024 0800 by Madison Rosina LABOR, LPN Outcome: Progressing 04/07/2024 1854 by Madison Rosina LABOR, LPN Outcome: Progressing Goal: Individualized Newborn Educational Video(s) 04/08/2024 0800 by Madison Rosina LABOR, LPN Outcome: Progressing 04/07/2024 1854 by Madison Rosina LABOR, LPN Outcome: Progressing   Problem: Activity: Goal: Will verbalize the importance of balancing activity with adequate rest periods 04/08/2024 0800 by Madison Rosina LABOR, LPN Outcome: Progressing 04/07/2024 1854 by Madison Rosina LABOR, LPN Outcome: Progressing Goal: Ability to tolerate increased activity will improve 04/08/2024 0800 by Madison Rosina LABOR, LPN Outcome:  Progressing 04/07/2024 1854 by Madison Rosina LABOR, LPN Outcome: Progressing   Problem: Coping: Goal: Ability to identify and utilize available resources and services will improve 04/08/2024 0800 by Madison Rosina LABOR, LPN Outcome: Progressing 04/07/2024 1854 by Madison Rosina LABOR, LPN Outcome: Progressing   Problem: Life Cycle: Goal: Chance of risk for complications during the postpartum period will decrease 04/08/2024 0800 by Madison Rosina LABOR, LPN Outcome: Progressing 04/07/2024 1854 by Madison Rosina LABOR, LPN Outcome: Progressing   Problem: Role Relationship: Goal: Ability to demonstrate positive interaction with newborn will improve 04/08/2024 0800 by Madison Rosina LABOR, LPN Outcome: Progressing 04/07/2024 1854 by Madison Rosina LABOR, LPN Outcome: Progressing   Problem: Skin Integrity: Goal: Demonstration of wound healing without infection will improve 04/08/2024 0800 by Madison Rosina LABOR, LPN Outcome: Progressing 04/07/2024 1854 by Madison Rosina LABOR, LPN Outcome: Progressing

## 2024-04-08 NOTE — Progress Notes (Signed)
 PPD# 1 SVD w/ intact perineum Information for the patient's newborn:  Dana Solomon, Dana Solomon [[id number]]  female  Baby's Name [facility] Request circumcision before discharge: No   S:   Reports feeling good Tolerating PO fluid and solids No nausea or vomiting Bleeding is light, moderate Pain controlled with acetaminophen  and ibuprofen  (OTC) Up ad lib / ambulatory / voiding w/o difficulty Feeding: Breast and donor milk    O:   VS: BP 124/74 (BP Location: Right Arm)   Pulse (!) 105   Temp 98 F (36.7 C) (Oral)   Resp 18   Ht 5' 4 (1.626 m)   Wt (!) 136.7 kg   LMP 06/27/2023 (Exact Date)   SpO2 100%   Breastfeeding Unknown   BMI 51.74 kg/m   LABS:  Recent Labs    04/07/24 1552 04/08/24 0443  WBC 10.7* 16.6*  HGB 9.5* 8.7*  PLT 293 314   Blood type: --/--/O NEG (12/15 1552) Rubella: Immune (04/29 0000)                      I&O: Intake/Output      12/15 0701 12/16 0700 12/16 0701 12/17 0700   I.V. (mL/kg) 119.5 (0.9)    Total Intake(mL/kg) 119.5 (0.9)    Urine (mL/kg/hr) 0    Blood 71    Total Output 71    Net +48.5         Urine Occurrence 2 x      Physical Exam: Alert and oriented X3 Lungs: Clear and unlabored Heart: regular rate and rhythm / no mumurs Abdomen: soft, non-tender, non-distended  Fundus: firm, non-tender, U-2 Perineum: intact Lochia: appropriate Extremities: trace edema, negative for calf pain, tenderness, or cords    A:  PPD # 1  Normal exam Rh neg rhogam received 04/08/24 Hx of pre-eclampsia    -mild to moderate range BP, start Procardia  XL 30 mg daily P:  Routine postpartum orders Contraception: natural family planning Anticipate D/C on PP day 2 Plan reviewed w/ Dr. Henry Mercer KATHEE Arnell, DNP, CNM 04/08/2024, 1:56 PM

## 2024-04-09 ENCOUNTER — Encounter (HOSPITAL_COMMUNITY)

## 2024-04-09 LAB — RH IG WORKUP (INCLUDES ABO/RH)
Fetal Screen: NEGATIVE
Gestational Age(Wks): 40
Unit division: 0

## 2024-04-09 MED ORDER — ACETAMINOPHEN 325 MG PO TABS: 650.0000 mg | ORAL_TABLET | Freq: Four times a day (QID) | ORAL | Status: AC

## 2024-04-09 MED ORDER — NIFEDIPINE ER 30 MG PO TB24: 30.0000 mg | ORAL_TABLET | Freq: Every day | ORAL | 1 refills | Status: AC

## 2024-04-09 MED ORDER — IBUPROFEN 600 MG PO TABS: 600.0000 mg | ORAL_TABLET | Freq: Four times a day (QID) | ORAL | 0 refills | Status: AC

## 2024-04-09 NOTE — Plan of Care (Signed)
   Problem: Education: Goal: Knowledge of General Education information will improve Description: Including pain rating scale, medication(s)/side effects and non-pharmacologic comfort measures Outcome: Progressing   Problem: Health Behavior/Discharge Planning: Goal: Ability to manage health-related needs will improve Outcome: Progressing   Problem: Clinical Measurements: Goal: Ability to maintain clinical measurements within normal limits will improve Outcome: Progressing Goal: Will remain free from infection Outcome: Progressing Goal: Diagnostic test results will improve Outcome: Progressing Goal: Respiratory complications will improve Outcome: Progressing Goal: Cardiovascular complication will be avoided Outcome: Progressing   Problem: Activity: Goal: Risk for activity intolerance will decrease Outcome: Progressing   Problem: Nutrition: Goal: Adequate nutrition will be maintained Outcome: Progressing   Problem: Coping: Goal: Level of anxiety will decrease Outcome: Progressing   Problem: Elimination: Goal: Will not experience complications related to bowel motility Outcome: Progressing Goal: Will not experience complications related to urinary retention Outcome: Progressing   Problem: Pain Managment: Goal: General experience of comfort will improve and/or be controlled Outcome: Progressing   Problem: Safety: Goal: Ability to remain free from injury will improve Outcome: Progressing   Problem: Skin Integrity: Goal: Risk for impaired skin integrity will decrease Outcome: Progressing   Problem: Education: Goal: Knowledge of condition will improve Outcome: Progressing Goal: Individualized Educational Video(s) Outcome: Progressing Goal: Individualized Newborn Educational Video(s) Outcome: Progressing   Problem: Activity: Goal: Will verbalize the importance of balancing activity with adequate rest periods Outcome: Progressing Goal: Ability to tolerate increased  activity will improve Outcome: Progressing   Problem: Coping: Goal: Ability to identify and utilize available resources and services will improve Outcome: Progressing   Problem: Life Cycle: Goal: Chance of risk for complications during the postpartum period will decrease Outcome: Progressing   Problem: Role Relationship: Goal: Ability to demonstrate positive interaction with newborn will improve Outcome: Progressing   Problem: Skin Integrity: Goal: Demonstration of wound healing without infection will improve Outcome: Progressing   Problem: Education: Goal: Knowledge of condition will improve Outcome: Progressing Goal: Individualized Educational Video(s) Outcome: Progressing Goal: Individualized Newborn Educational Video(s) Outcome: Progressing   Problem: Activity: Goal: Will verbalize the importance of balancing activity with adequate rest periods Outcome: Progressing Goal: Ability to tolerate increased activity will improve Outcome: Progressing   Problem: Coping: Goal: Ability to identify and utilize available resources and services will improve Outcome: Progressing   Problem: Life Cycle: Goal: Chance of risk for complications during the postpartum period will decrease Outcome: Progressing   Problem: Role Relationship: Goal: Ability to demonstrate positive interaction with newborn will improve Outcome: Progressing   Problem: Skin Integrity: Goal: Demonstration of wound healing without infection will improve Outcome: Progressing

## 2024-04-09 NOTE — Plan of Care (Signed)
 Problem: Education: Goal: Knowledge of General Education information will improve Description: Including pain rating scale, medication(s)/side effects and non-pharmacologic comfort measures 04/09/2024 0800 by Madison Rosina LABOR, LPN Outcome: Adequate for Discharge 04/09/2024 0745 by Madison Rosina LABOR, LPN Outcome: Progressing   Problem: Health Behavior/Discharge Planning: Goal: Ability to manage health-related needs will improve 04/09/2024 0800 by Madison Rosina LABOR, LPN Outcome: Adequate for Discharge 04/09/2024 0745 by Madison Rosina LABOR, LPN Outcome: Progressing   Problem: Clinical Measurements: Goal: Ability to maintain clinical measurements within normal limits will improve 04/09/2024 0800 by Madison Rosina LABOR, LPN Outcome: Adequate for Discharge 04/09/2024 0745 by Madison Rosina LABOR, LPN Outcome: Progressing Goal: Will remain free from infection 04/09/2024 0800 by Madison Rosina LABOR, LPN Outcome: Adequate for Discharge 04/09/2024 0745 by Madison Rosina LABOR, LPN Outcome: Progressing Goal: Diagnostic test results will improve 04/09/2024 0800 by Madison Rosina LABOR, LPN Outcome: Adequate for Discharge 04/09/2024 0745 by Madison Rosina LABOR, LPN Outcome: Progressing Goal: Respiratory complications will improve 04/09/2024 0800 by Madison Rosina LABOR, LPN Outcome: Adequate for Discharge 04/09/2024 0745 by Madison Rosina LABOR, LPN Outcome: Progressing Goal: Cardiovascular complication will be avoided 04/09/2024 0800 by Madison Rosina LABOR, LPN Outcome: Adequate for Discharge 04/09/2024 0745 by Madison Rosina LABOR, LPN Outcome: Progressing   Problem: Activity: Goal: Risk for activity intolerance will decrease 04/09/2024 0800 by Madison Rosina LABOR, LPN Outcome: Adequate for Discharge 04/09/2024 0745 by Madison Rosina LABOR, LPN Outcome: Progressing   Problem: Nutrition: Goal: Adequate nutrition will be maintained 04/09/2024 0800 by Madison Rosina LABOR, LPN Outcome: Adequate for Discharge 04/09/2024 0745 by Madison Rosina LABOR, LPN Outcome:  Progressing   Problem: Coping: Goal: Level of anxiety will decrease 04/09/2024 0800 by Madison Rosina LABOR, LPN Outcome: Adequate for Discharge 04/09/2024 0745 by Madison Rosina LABOR, LPN Outcome: Progressing   Problem: Elimination: Goal: Will not experience complications related to bowel motility 04/09/2024 0800 by Madison Rosina LABOR, LPN Outcome: Adequate for Discharge 04/09/2024 0745 by Madison Rosina LABOR, LPN Outcome: Progressing Goal: Will not experience complications related to urinary retention 04/09/2024 0800 by Madison Rosina LABOR, LPN Outcome: Adequate for Discharge 04/09/2024 0745 by Madison Rosina LABOR, LPN Outcome: Progressing   Problem: Pain Managment: Goal: General experience of comfort will improve and/or be controlled 04/09/2024 0800 by Madison Rosina LABOR, LPN Outcome: Adequate for Discharge 04/09/2024 0745 by Madison Rosina LABOR, LPN Outcome: Progressing   Problem: Safety: Goal: Ability to remain free from injury will improve 04/09/2024 0800 by Madison Rosina LABOR, LPN Outcome: Adequate for Discharge 04/09/2024 0745 by Madison Rosina LABOR, LPN Outcome: Progressing   Problem: Skin Integrity: Goal: Risk for impaired skin integrity will decrease 04/09/2024 0800 by Madison Rosina LABOR, LPN Outcome: Adequate for Discharge 04/09/2024 0745 by Madison Rosina LABOR, LPN Outcome: Progressing   Problem: Education: Goal: Knowledge of condition will improve 04/09/2024 0800 by Madison Rosina LABOR, LPN Outcome: Adequate for Discharge 04/09/2024 0745 by Madison Rosina LABOR, LPN Outcome: Progressing Goal: Individualized Educational Video(s) 04/09/2024 0800 by Madison Rosina LABOR, LPN Outcome: Adequate for Discharge 04/09/2024 0745 by Madison Rosina LABOR, LPN Outcome: Progressing Goal: Individualized Newborn Educational Video(s) 04/09/2024 0800 by Madison Rosina LABOR, LPN Outcome: Adequate for Discharge 04/09/2024 0745 by Madison Rosina LABOR, LPN Outcome: Progressing   Problem: Activity: Goal: Will verbalize the importance of balancing  activity with adequate rest periods 04/09/2024 0800 by Madison Rosina LABOR, LPN Outcome: Adequate for Discharge 04/09/2024 0745 by Madison Rosina LABOR, LPN Outcome: Progressing Goal: Ability to tolerate increased activity will improve 04/09/2024 0800 by Madison Rosina LABOR, LPN  Outcome: Adequate for Discharge 04/09/2024 0745 by Madison Rosina LABOR, LPN Outcome: Progressing   Problem: Coping: Goal: Ability to identify and utilize available resources and services will improve 04/09/2024 0800 by Madison Rosina LABOR, LPN Outcome: Adequate for Discharge 04/09/2024 0745 by Madison Rosina LABOR, LPN Outcome: Progressing   Problem: Life Cycle: Goal: Chance of risk for complications during the postpartum period will decrease 04/09/2024 0800 by Madison Rosina LABOR, LPN Outcome: Adequate for Discharge 04/09/2024 0745 by Madison Rosina LABOR, LPN Outcome: Progressing   Problem: Role Relationship: Goal: Ability to demonstrate positive interaction with newborn will improve 04/09/2024 0800 by Madison Rosina LABOR, LPN Outcome: Adequate for Discharge 04/09/2024 0745 by Madison Rosina LABOR, LPN Outcome: Progressing   Problem: Skin Integrity: Goal: Demonstration of wound healing without infection will improve 04/09/2024 0800 by Madison Rosina LABOR, LPN Outcome: Adequate for Discharge 04/09/2024 0745 by Madison Rosina LABOR, LPN Outcome: Progressing   Problem: Education: Goal: Knowledge of condition will improve 04/09/2024 0800 by Madison Rosina LABOR, LPN Outcome: Adequate for Discharge 04/09/2024 0745 by Madison Rosina LABOR, LPN Outcome: Progressing Goal: Individualized Educational Video(s) 04/09/2024 0800 by Madison Rosina LABOR, LPN Outcome: Adequate for Discharge 04/09/2024 0745 by Madison Rosina LABOR, LPN Outcome: Progressing Goal: Individualized Newborn Educational Video(s) 04/09/2024 0800 by Madison Rosina LABOR, LPN Outcome: Adequate for Discharge 04/09/2024 0745 by Madison Rosina LABOR, LPN Outcome: Progressing   Problem: Activity: Goal: Will verbalize the  importance of balancing activity with adequate rest periods 04/09/2024 0800 by Madison Rosina LABOR, LPN Outcome: Adequate for Discharge 04/09/2024 0745 by Madison Rosina LABOR, LPN Outcome: Progressing Goal: Ability to tolerate increased activity will improve 04/09/2024 0800 by Madison Rosina LABOR, LPN Outcome: Adequate for Discharge 04/09/2024 0745 by Madison Rosina LABOR, LPN Outcome: Progressing   Problem: Coping: Goal: Ability to identify and utilize available resources and services will improve 04/09/2024 0800 by Madison Rosina LABOR, LPN Outcome: Adequate for Discharge 04/09/2024 0745 by Madison Rosina LABOR, LPN Outcome: Progressing   Problem: Life Cycle: Goal: Chance of risk for complications during the postpartum period will decrease 04/09/2024 0800 by Madison Rosina LABOR, LPN Outcome: Adequate for Discharge 04/09/2024 0745 by Madison Rosina LABOR, LPN Outcome: Progressing   Problem: Role Relationship: Goal: Ability to demonstrate positive interaction with newborn will improve 04/09/2024 0800 by Madison Rosina LABOR, LPN Outcome: Adequate for Discharge 04/09/2024 0745 by Madison Rosina LABOR, LPN Outcome: Progressing   Problem: Skin Integrity: Goal: Demonstration of wound healing without infection will improve 04/09/2024 0800 by Madison Rosina LABOR, LPN Outcome: Adequate for Discharge 04/09/2024 0745 by Madison Rosina LABOR, LPN Outcome: Progressing

## 2024-04-09 NOTE — Discharge Instructions (Signed)

## 2024-04-10 ENCOUNTER — Inpatient Hospital Stay (HOSPITAL_COMMUNITY): Admission: RE | Admit: 2024-04-10 | Source: Home / Self Care | Admitting: Obstetrics and Gynecology

## 2024-04-10 ENCOUNTER — Inpatient Hospital Stay (HOSPITAL_COMMUNITY)

## 2024-04-15 ENCOUNTER — Telehealth (HOSPITAL_COMMUNITY): Payer: Self-pay | Admitting: *Deleted

## 2024-04-15 DIAGNOSIS — O165 Unspecified maternal hypertension, complicating the puerperium: Secondary | ICD-10-CM | POA: Diagnosis not present

## 2024-04-15 NOTE — Telephone Encounter (Signed)
 Attempted hospital discharge follow-up phone call. Patient answered and asked RN to call back at a later time. Allean IVAR Carton, RN, 04/15/24, 1120

## 2024-04-18 ENCOUNTER — Telehealth (HOSPITAL_COMMUNITY): Payer: Self-pay | Admitting: *Deleted

## 2024-04-18 NOTE — Telephone Encounter (Signed)
 04/18/2024  Name: Brentney Goldbach MRN: [id number] DOB: [birth year]  Reason for Call:  [facility] of Care [facility] Discharge Call  Contact Status: Patient Contact Status: Unable to contact  Language assistant needed:          Follow-Up Questions:    Van Postnatal Depression Scale:  In the Past 7 Days:    PHQ2-9 Depression Scale:     Discharge Follow-up:    Post-discharge interventions: NA  Mliss Sieve, RN 04/18/2024 10:57
